# Patient Record
Sex: Male | Born: 1945 | Race: White | Hispanic: No | Marital: Married | Smoking: Former smoker
Health system: Southern US, Community
[De-identification: ages and names within clinical notes are randomized; demographics above are authoritative.]

## PROBLEM LIST (undated history)

## (undated) DIAGNOSIS — Z8481 Family history of carrier of genetic disease: Secondary | ICD-10-CM

## (undated) DIAGNOSIS — Z803 Family history of malignant neoplasm of breast: Secondary | ICD-10-CM

## (undated) HISTORY — PX: NECK SURGERY: SHX720

## (undated) HISTORY — DX: Family history of carrier of genetic disease: Z84.81

## (undated) HISTORY — DX: Family history of malignant neoplasm of breast: Z80.3

## (undated) HISTORY — PX: WISDOM TOOTH EXTRACTION: SHX21

## (undated) HISTORY — PX: TONSILLECTOMY: SUR1361

## (undated) HISTORY — PX: BACK SURGERY: SHX140

## (undated) HISTORY — PX: MITRAL VALVE REPLACEMENT: SHX147

## (undated) HISTORY — PX: ROTATOR CUFF REPAIR: SHX139

## (undated) HISTORY — PX: EYE SURGERY: SHX253

---

## 1997-10-17 ENCOUNTER — Ambulatory Visit (HOSPITAL_COMMUNITY): Admission: RE | Admit: 1997-10-17 | Discharge: 1997-10-17 | Payer: Self-pay | Admitting: *Deleted

## 1998-05-30 ENCOUNTER — Ambulatory Visit (HOSPITAL_COMMUNITY): Admission: RE | Admit: 1998-05-30 | Discharge: 1998-05-30 | Payer: Self-pay | Admitting: Family Medicine

## 1998-05-30 ENCOUNTER — Encounter: Payer: Self-pay | Admitting: Family Medicine

## 2002-06-05 ENCOUNTER — Ambulatory Visit (HOSPITAL_COMMUNITY): Admission: RE | Admit: 2002-06-05 | Discharge: 2002-06-05 | Payer: Self-pay | Admitting: Internal Medicine

## 2004-08-07 ENCOUNTER — Ambulatory Visit: Payer: Self-pay

## 2005-05-19 ENCOUNTER — Encounter (INDEPENDENT_AMBULATORY_CARE_PROVIDER_SITE_OTHER): Payer: Self-pay | Admitting: Specialist

## 2005-05-20 ENCOUNTER — Inpatient Hospital Stay (HOSPITAL_COMMUNITY): Admission: RE | Admit: 2005-05-20 | Discharge: 2005-05-21 | Payer: Self-pay | Admitting: Specialist

## 2005-08-27 ENCOUNTER — Ambulatory Visit: Payer: Self-pay

## 2005-08-27 ENCOUNTER — Encounter: Payer: Self-pay | Admitting: Cardiology

## 2007-02-25 IMAGING — CR DG LUMBAR SPINE 2-3V
3 series · 3 of 3 positions shown · non-contrast
Comparison: none

CLINICAL DATA: Preoperative exam for back surgery in five days.  Left lower pain radiating to the buttock.
 LUMBAR SPINE ? 2 VIEW:

[t l-spine a.p.]
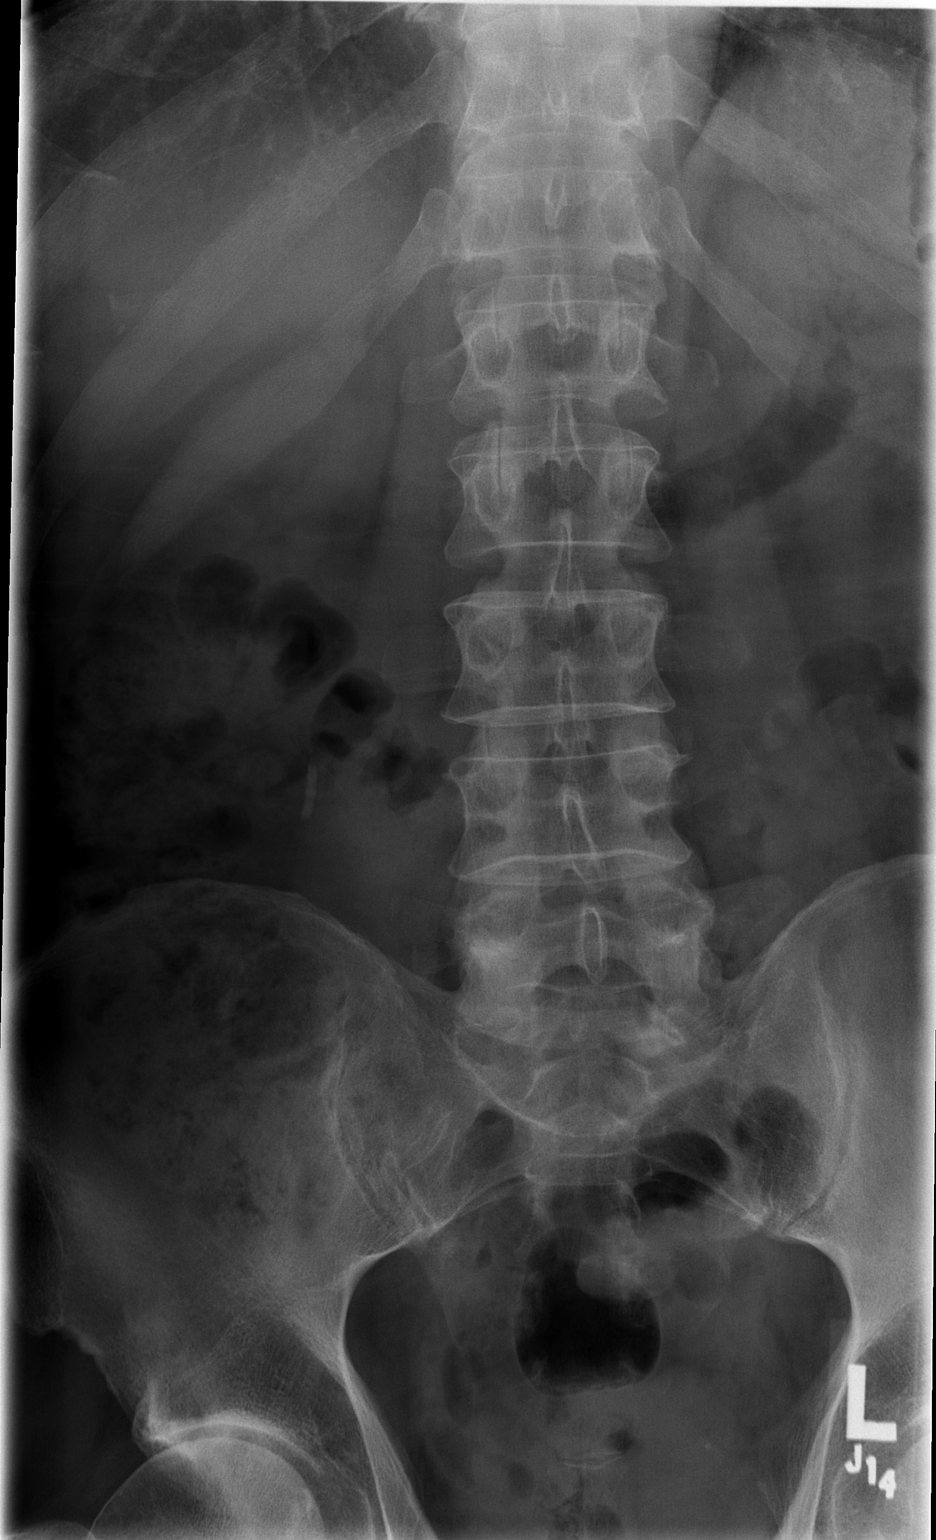

[t l-spine lat]
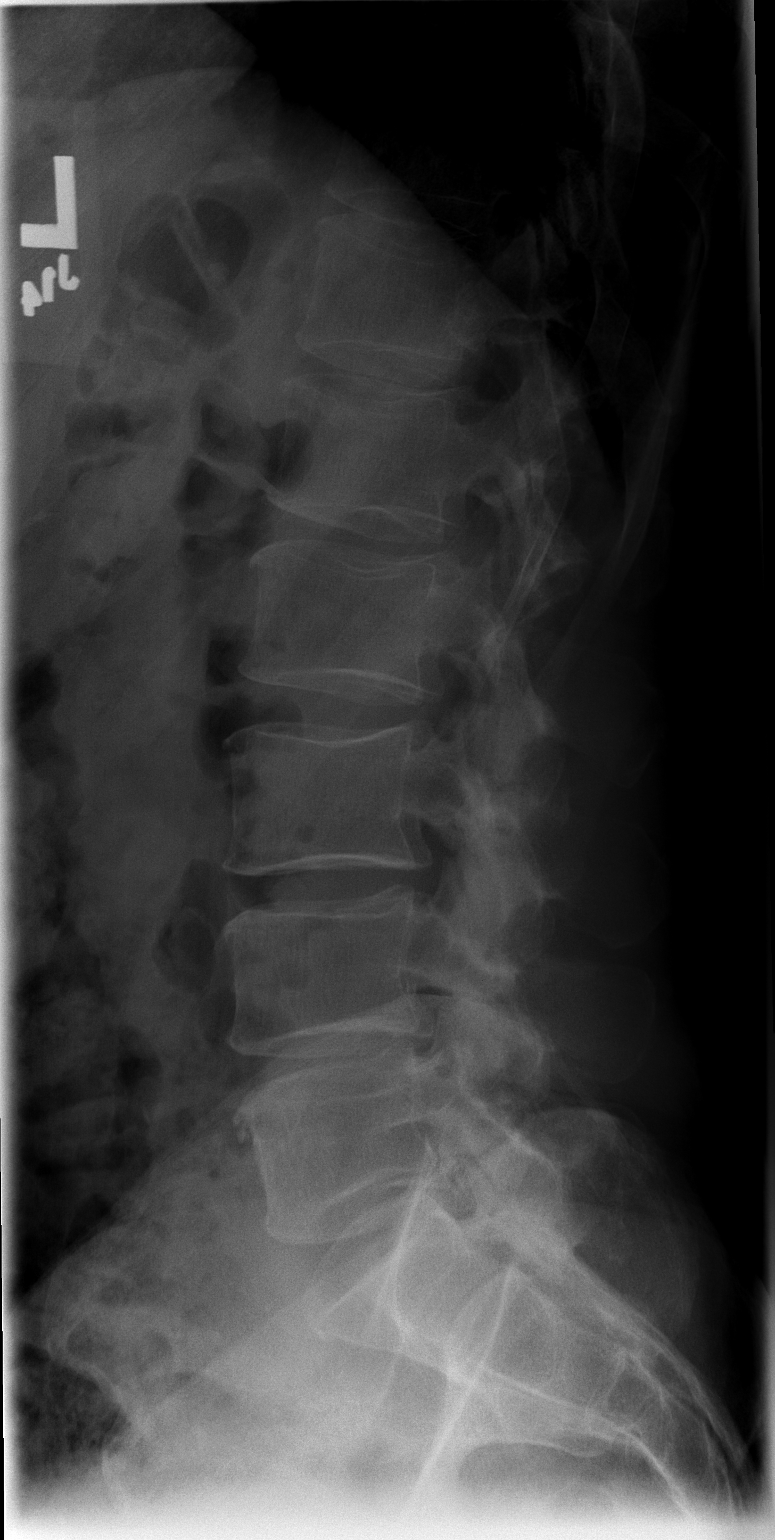

[t l-spine l5-s1 spot]
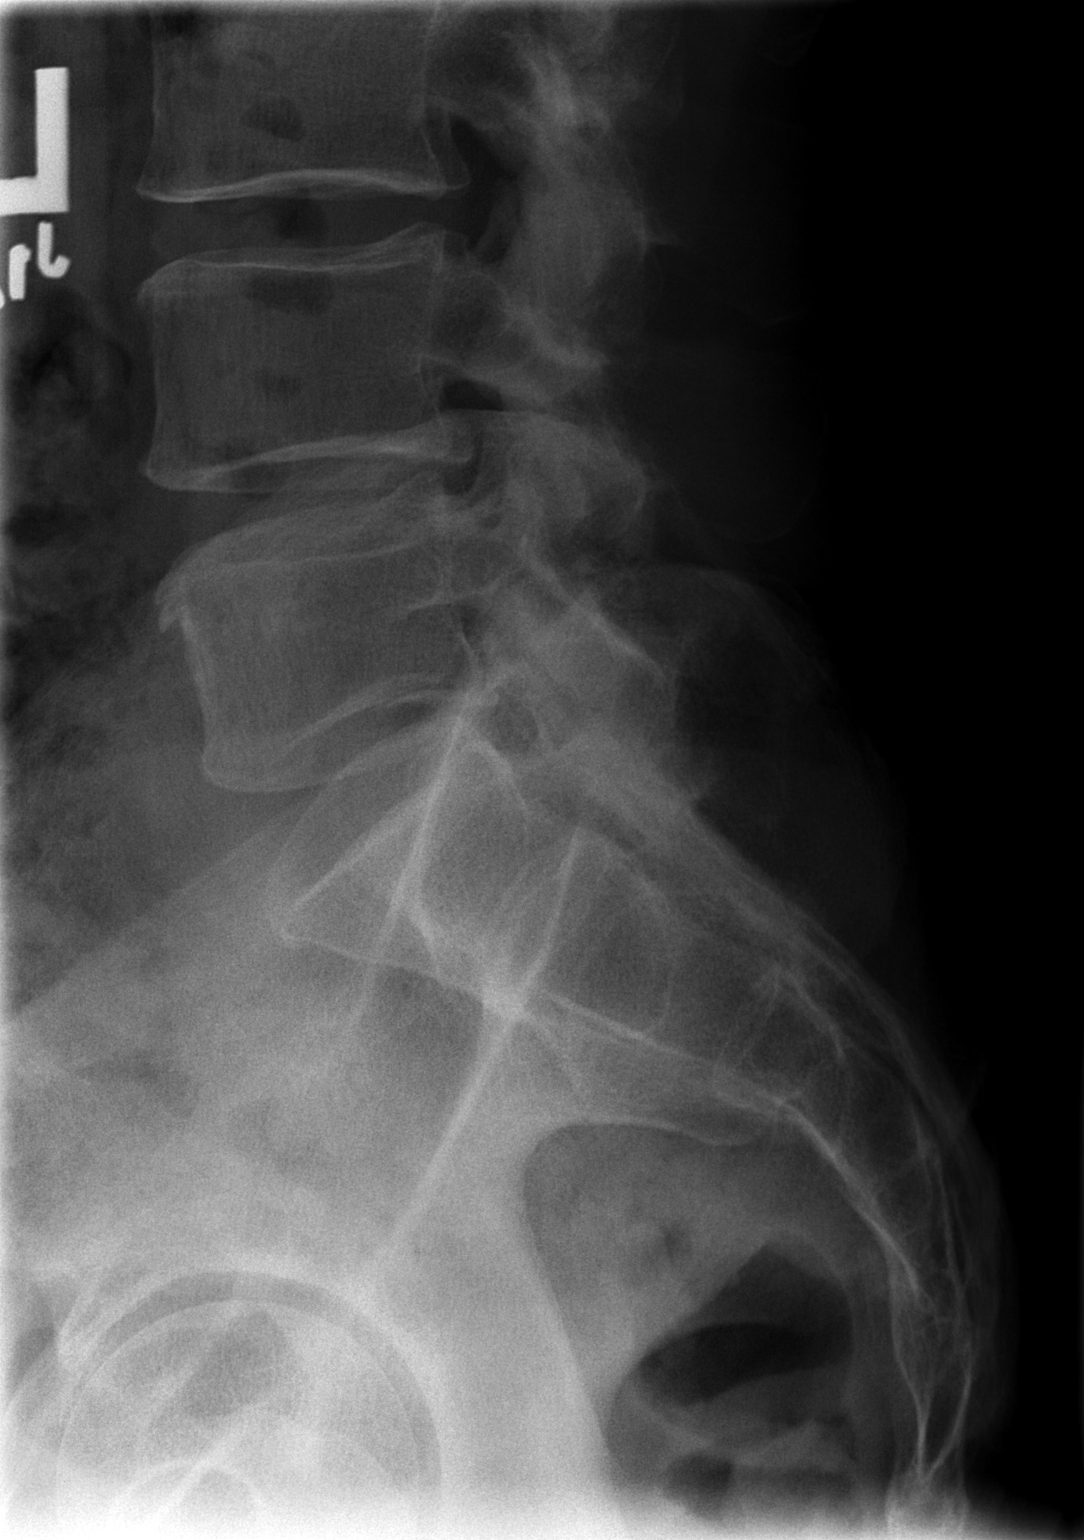

[3 of 3 positions shown; findings below may reference images not displayed]

FINDINGS: There are five lumbar type vertebral bodies in normal alignment.  There is mild disk space narrowing at L3-4 and L4-5.  There are mild facet degenerative changes at L3-4, L4-5, and L5-S1.  No pars defects or slippage.  The SI joints appear unremarkable.
IMPRESSION: Degenerative disk disease and degenerative facet disease in the lower lumbar spine.

## 2007-05-15 ENCOUNTER — Ambulatory Visit (HOSPITAL_COMMUNITY): Admission: RE | Admit: 2007-05-15 | Discharge: 2007-05-15 | Payer: Self-pay | Admitting: Neurosurgery

## 2007-07-04 ENCOUNTER — Inpatient Hospital Stay (HOSPITAL_COMMUNITY): Admission: RE | Admit: 2007-07-04 | Discharge: 2007-07-06 | Payer: Self-pay | Admitting: Neurosurgery

## 2007-07-08 ENCOUNTER — Inpatient Hospital Stay (HOSPITAL_COMMUNITY): Admission: EM | Admit: 2007-07-08 | Discharge: 2007-07-09 | Payer: Self-pay | Admitting: Emergency Medicine

## 2007-07-28 ENCOUNTER — Encounter (INDEPENDENT_AMBULATORY_CARE_PROVIDER_SITE_OTHER): Payer: Self-pay | Admitting: Internal Medicine

## 2007-07-28 ENCOUNTER — Ambulatory Visit: Payer: Self-pay

## 2007-08-18 ENCOUNTER — Ambulatory Visit: Payer: Self-pay | Admitting: Cardiovascular Disease

## 2007-08-25 ENCOUNTER — Ambulatory Visit: Payer: Self-pay | Admitting: Cardiovascular Disease

## 2007-08-25 ENCOUNTER — Ambulatory Visit (HOSPITAL_COMMUNITY): Admission: RE | Admit: 2007-08-25 | Discharge: 2007-08-25 | Payer: Self-pay | Admitting: Cardiovascular Disease

## 2007-09-04 ENCOUNTER — Ambulatory Visit: Payer: Self-pay | Admitting: Cardiovascular Disease

## 2007-09-18 ENCOUNTER — Ambulatory Visit: Payer: Self-pay | Admitting: Thoracic Surgery (Cardiothoracic Vascular Surgery)

## 2007-09-27 ENCOUNTER — Encounter
Admission: RE | Admit: 2007-09-27 | Discharge: 2007-09-27 | Payer: Self-pay | Admitting: Thoracic Surgery (Cardiothoracic Vascular Surgery)

## 2007-10-03 ENCOUNTER — Ambulatory Visit: Payer: Self-pay | Admitting: Cardiovascular Disease

## 2007-10-03 LAB — CONVERTED CEMR LAB
BUN: 15 mg/dL (ref 6–23)
Basophils Absolute: 0 10*3/uL (ref 0.0–0.1)
Calcium: 9.1 mg/dL (ref 8.4–10.5)
Creatinine, Ser: 0.8 mg/dL (ref 0.4–1.5)
Eosinophils Relative: 1.1 % (ref 0.0–5.0)
GFR calc Af Amer: 126 mL/min
GFR calc non Af Amer: 104 mL/min
Glucose, Bld: 103 mg/dL — ABNORMAL HIGH (ref 70–99)
HCT: 40.7 % (ref 39.0–52.0)
INR: 1 (ref 0.8–1.0)
Monocytes Absolute: 0.5 10*3/uL (ref 0.1–1.0)
Monocytes Relative: 9.2 % (ref 3.0–12.0)
Neutro Abs: 4 10*3/uL (ref 1.4–7.7)
Neutrophils Relative %: 68.4 % (ref 43.0–77.0)
Platelets: 239 10*3/uL (ref 150–400)
Prothrombin Time: 12.4 s (ref 10.9–13.3)
WBC: 5.8 10*3/uL (ref 4.5–10.5)
aPTT: 30.8 s — ABNORMAL HIGH (ref 21.7–29.8)

## 2007-10-05 ENCOUNTER — Inpatient Hospital Stay (HOSPITAL_BASED_OUTPATIENT_CLINIC_OR_DEPARTMENT_OTHER): Admission: RE | Admit: 2007-10-05 | Discharge: 2007-10-05 | Payer: Self-pay | Admitting: Cardiovascular Disease

## 2007-10-05 ENCOUNTER — Ambulatory Visit: Payer: Self-pay | Admitting: Cardiovascular Disease

## 2007-11-03 ENCOUNTER — Ambulatory Visit: Payer: Self-pay | Admitting: Surgery

## 2007-11-03 ENCOUNTER — Encounter: Payer: Self-pay | Admitting: Thoracic Surgery (Cardiothoracic Vascular Surgery)

## 2007-11-06 ENCOUNTER — Ambulatory Visit: Payer: Self-pay | Admitting: Thoracic Surgery (Cardiothoracic Vascular Surgery)

## 2007-11-07 ENCOUNTER — Ambulatory Visit: Payer: Self-pay | Admitting: Thoracic Surgery (Cardiothoracic Vascular Surgery)

## 2007-11-07 ENCOUNTER — Inpatient Hospital Stay (HOSPITAL_COMMUNITY)
Admission: RE | Admit: 2007-11-07 | Discharge: 2007-11-11 | Payer: Self-pay | Admitting: Thoracic Surgery (Cardiothoracic Vascular Surgery)

## 2007-11-07 ENCOUNTER — Encounter: Payer: Self-pay | Admitting: Thoracic Surgery (Cardiothoracic Vascular Surgery)

## 2007-11-13 ENCOUNTER — Ambulatory Visit: Payer: Self-pay | Admitting: Cardiology

## 2007-11-13 LAB — CONVERTED CEMR LAB: Prothrombin Time: 16.3 s — ABNORMAL HIGH (ref 10.9–13.3)

## 2007-11-16 ENCOUNTER — Ambulatory Visit: Payer: Self-pay | Admitting: Internal Medicine

## 2007-11-20 ENCOUNTER — Encounter
Admission: RE | Admit: 2007-11-20 | Discharge: 2007-11-20 | Payer: Self-pay | Admitting: Thoracic Surgery (Cardiothoracic Vascular Surgery)

## 2007-11-20 ENCOUNTER — Ambulatory Visit: Payer: Self-pay | Admitting: Thoracic Surgery (Cardiothoracic Vascular Surgery)

## 2007-11-22 ENCOUNTER — Ambulatory Visit: Payer: Self-pay | Admitting: Cardiovascular Disease

## 2007-11-29 ENCOUNTER — Ambulatory Visit: Payer: Self-pay | Admitting: Internal Medicine

## 2007-12-06 ENCOUNTER — Ambulatory Visit: Payer: Self-pay | Admitting: Internal Medicine

## 2007-12-07 ENCOUNTER — Encounter (HOSPITAL_COMMUNITY): Admission: RE | Admit: 2007-12-07 | Discharge: 2008-03-06 | Payer: Self-pay | Admitting: Cardiovascular Disease

## 2007-12-08 ENCOUNTER — Encounter
Admission: RE | Admit: 2007-12-08 | Discharge: 2007-12-08 | Payer: Self-pay | Admitting: Thoracic Surgery (Cardiothoracic Vascular Surgery)

## 2007-12-08 ENCOUNTER — Ambulatory Visit: Payer: Self-pay | Admitting: Thoracic Surgery (Cardiothoracic Vascular Surgery)

## 2007-12-11 ENCOUNTER — Ambulatory Visit: Payer: Self-pay | Admitting: Cardiovascular Disease

## 2007-12-20 ENCOUNTER — Ambulatory Visit: Payer: Self-pay | Admitting: Cardiology

## 2008-01-12 ENCOUNTER — Ambulatory Visit: Payer: Self-pay | Admitting: Cardiovascular Disease

## 2008-04-01 ENCOUNTER — Ambulatory Visit: Payer: Self-pay | Admitting: Thoracic Surgery (Cardiothoracic Vascular Surgery)

## 2008-05-24 ENCOUNTER — Encounter: Payer: Self-pay | Admitting: Thoracic Surgery (Cardiothoracic Vascular Surgery)

## 2008-05-24 ENCOUNTER — Ambulatory Visit: Payer: Self-pay

## 2008-07-08 DIAGNOSIS — I959 Hypotension, unspecified: Secondary | ICD-10-CM

## 2008-07-08 DIAGNOSIS — I4891 Unspecified atrial fibrillation: Secondary | ICD-10-CM

## 2008-07-08 DIAGNOSIS — M4800 Spinal stenosis, site unspecified: Secondary | ICD-10-CM

## 2008-07-08 DIAGNOSIS — R42 Dizziness and giddiness: Secondary | ICD-10-CM

## 2008-07-08 DIAGNOSIS — Q078 Other specified congenital malformations of nervous system: Secondary | ICD-10-CM | POA: Insufficient documentation

## 2008-07-08 DIAGNOSIS — I059 Rheumatic mitral valve disease, unspecified: Secondary | ICD-10-CM | POA: Insufficient documentation

## 2008-07-08 DIAGNOSIS — R002 Palpitations: Secondary | ICD-10-CM | POA: Insufficient documentation

## 2008-07-15 ENCOUNTER — Encounter: Admission: RE | Admit: 2008-07-15 | Discharge: 2008-07-15 | Payer: Self-pay | Admitting: Neurosurgery

## 2008-07-22 ENCOUNTER — Encounter: Admission: RE | Admit: 2008-07-22 | Discharge: 2008-07-22 | Payer: Self-pay | Admitting: Neurosurgery

## 2008-07-25 ENCOUNTER — Telehealth: Payer: Self-pay | Admitting: Cardiovascular Disease

## 2009-08-16 IMAGING — CR DG CHEST 2V
2 series · 2 of 2 positions shown · non-contrast
Comparison: 07/03/2007

CLINICAL DATA: Mitral valve prolapse.  Preoperative  exam.

CHEST - 2 VIEW

[view not recorded (1 of 2)]
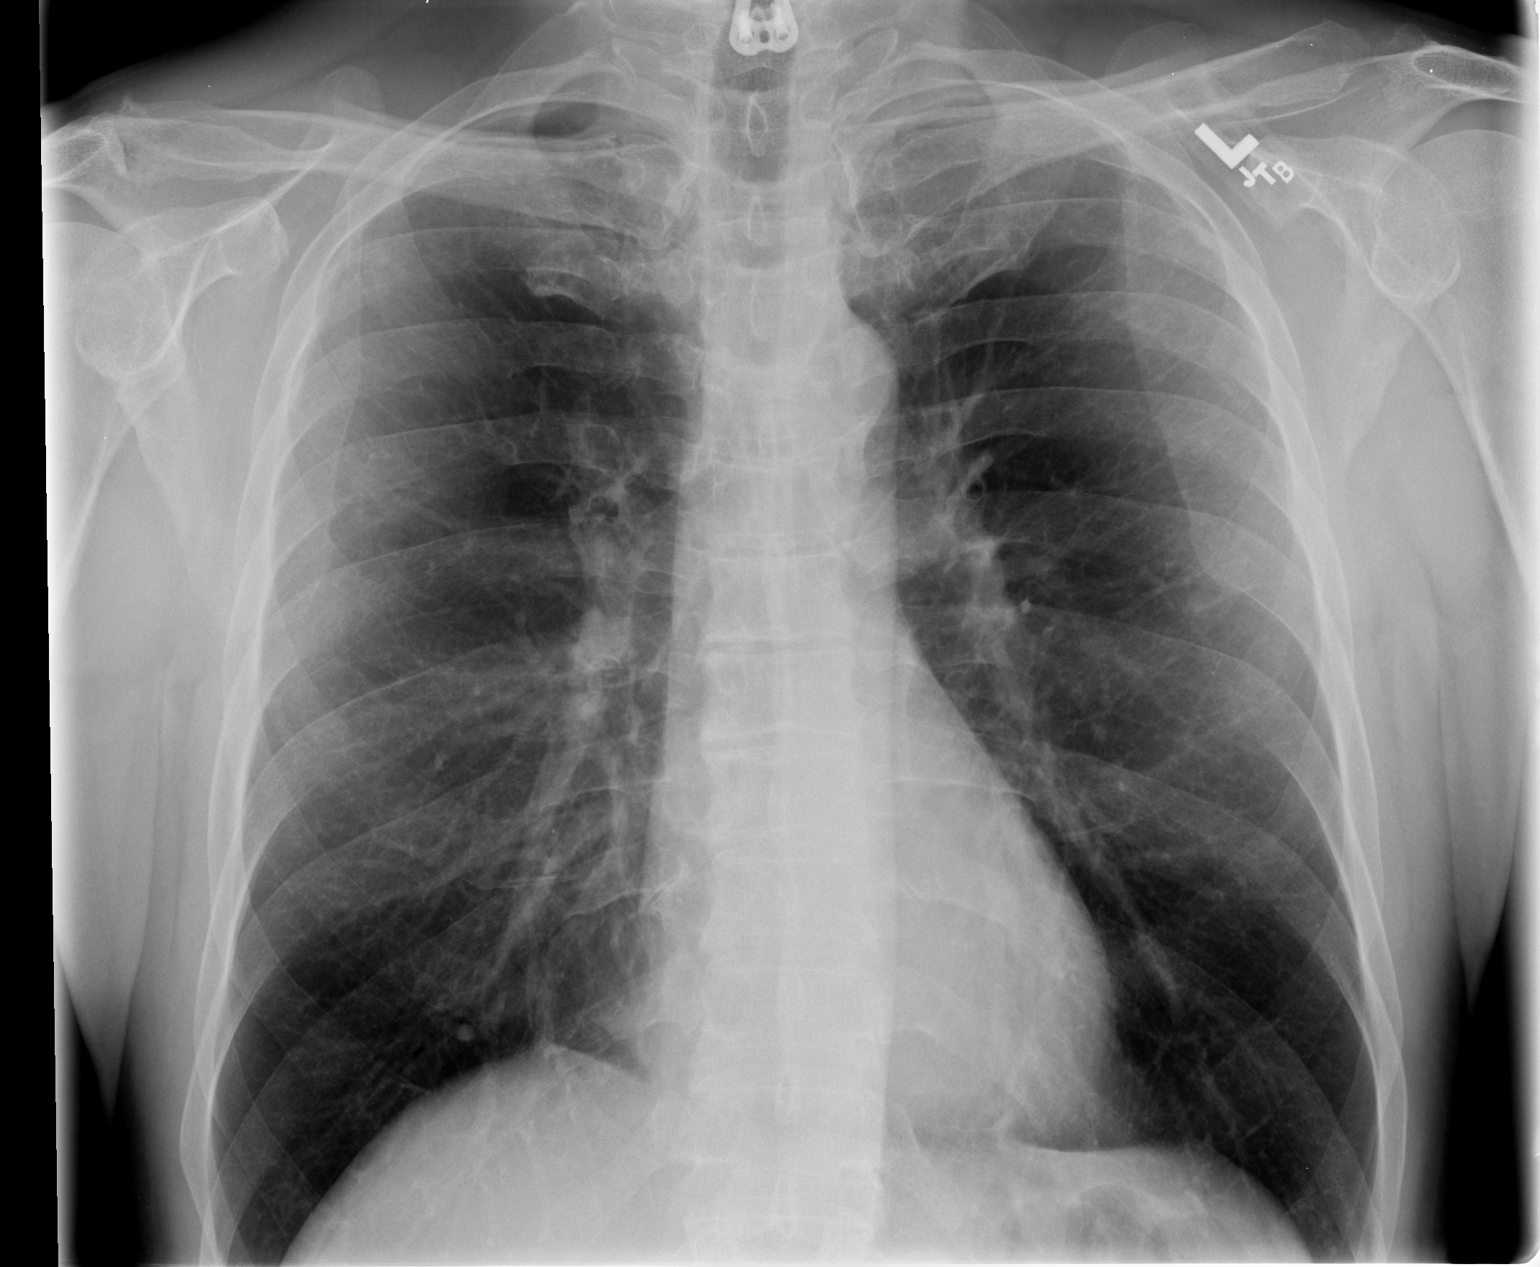

[view not recorded (2 of 2)]
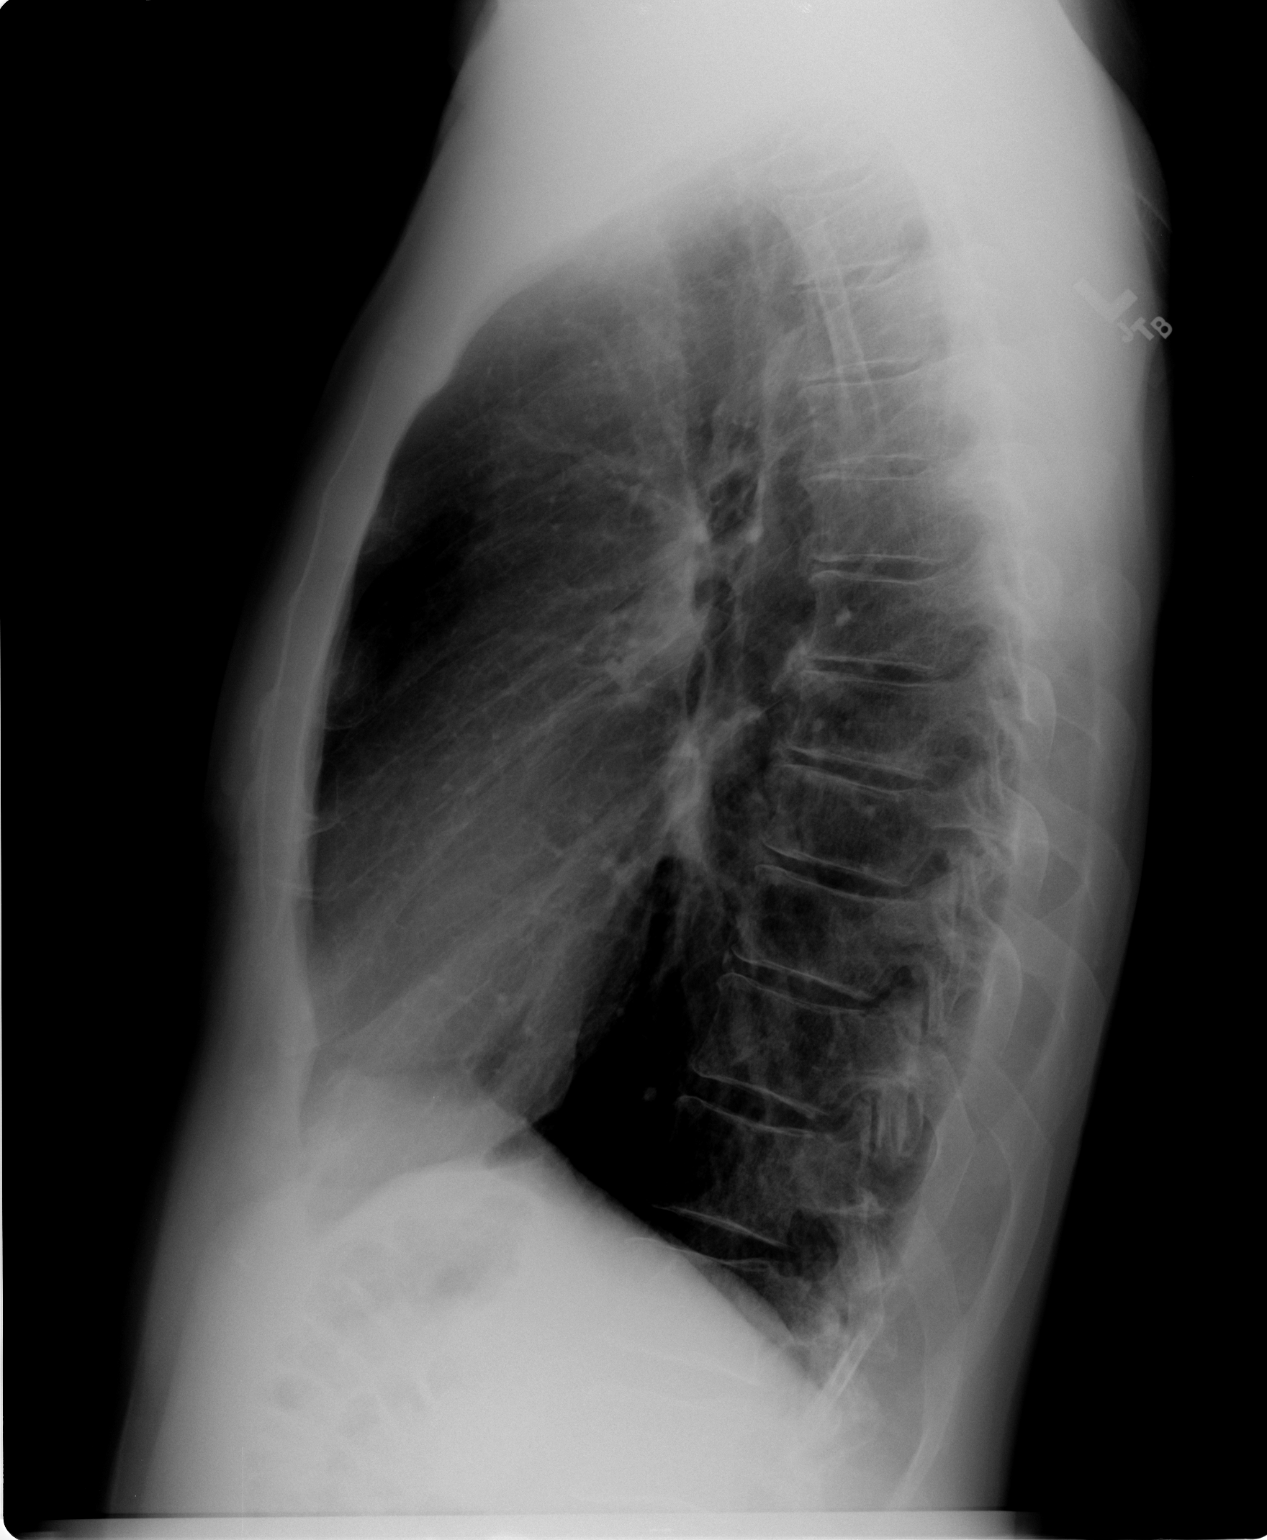

[2 of 2 positions shown; findings below may reference images not displayed]

FINDINGS: The cardiac silhouette, mediastinal and hilar contours
are within normal limits and stable.  There are mild stable changes
of COPD.  No acute pulmonary findings.  The bony thorax is intact.
Fusion hardware is noted in the lower cervical spine.
IMPRESSION: 1.  No acute cardiopulmonary findings.  Stable changes of COPD.

## 2009-12-30 ENCOUNTER — Encounter: Admission: RE | Admit: 2009-12-30 | Discharge: 2009-12-30 | Payer: Self-pay | Admitting: Neurosurgery

## 2010-06-16 ENCOUNTER — Other Ambulatory Visit: Payer: Self-pay | Admitting: Neurosurgery

## 2010-06-16 DIAGNOSIS — M549 Dorsalgia, unspecified: Secondary | ICD-10-CM

## 2010-06-17 ENCOUNTER — Ambulatory Visit
Admission: RE | Admit: 2010-06-17 | Discharge: 2010-06-17 | Disposition: A | Discharge status: Home / Self Care | Payer: Medicare Other | Source: Ambulatory Visit | Attending: Neurosurgery | Admitting: Neurosurgery

## 2010-06-17 ENCOUNTER — Other Ambulatory Visit: Payer: Self-pay | Admitting: Neurosurgery

## 2010-06-17 DIAGNOSIS — M549 Dorsalgia, unspecified: Secondary | ICD-10-CM

## 2010-07-07 ENCOUNTER — Other Ambulatory Visit: Payer: Self-pay | Admitting: Neurosurgery

## 2010-07-07 DIAGNOSIS — M549 Dorsalgia, unspecified: Secondary | ICD-10-CM

## 2010-07-07 DIAGNOSIS — M5410 Radiculopathy, site unspecified: Secondary | ICD-10-CM

## 2010-07-08 ENCOUNTER — Ambulatory Visit
Admission: RE | Admit: 2010-07-08 | Discharge: 2010-07-08 | Disposition: A | Discharge status: Home / Self Care | Payer: Medicare Other | Source: Ambulatory Visit | Attending: Neurosurgery | Admitting: Neurosurgery

## 2010-07-08 DIAGNOSIS — M5410 Radiculopathy, site unspecified: Secondary | ICD-10-CM

## 2010-07-08 DIAGNOSIS — M549 Dorsalgia, unspecified: Secondary | ICD-10-CM

## 2010-07-09 ENCOUNTER — Other Ambulatory Visit (HOSPITAL_COMMUNITY): Payer: Self-pay | Admitting: Neurosurgery

## 2010-07-09 ENCOUNTER — Encounter (HOSPITAL_COMMUNITY)
Admission: RE | Admit: 2010-07-09 | Discharge: 2010-07-09 | Disposition: A | Discharge status: Home / Self Care | Payer: Medicare Other | Source: Ambulatory Visit | Attending: Neurosurgery | Admitting: Neurosurgery

## 2010-07-09 ENCOUNTER — Telehealth: Payer: Self-pay | Admitting: Cardiovascular Disease

## 2010-07-09 ENCOUNTER — Ambulatory Visit (HOSPITAL_COMMUNITY)
Admission: RE | Admit: 2010-07-09 | Discharge: 2010-07-09 | Disposition: A | Discharge status: Home / Self Care | Payer: Medicare Other | Source: Ambulatory Visit | Attending: Neurosurgery | Admitting: Neurosurgery

## 2010-07-09 DIAGNOSIS — Z01818 Encounter for other preprocedural examination: Secondary | ICD-10-CM | POA: Insufficient documentation

## 2010-07-09 DIAGNOSIS — Z01812 Encounter for preprocedural laboratory examination: Secondary | ICD-10-CM | POA: Insufficient documentation

## 2010-07-09 DIAGNOSIS — Z954 Presence of other heart-valve replacement: Secondary | ICD-10-CM | POA: Insufficient documentation

## 2010-07-09 DIAGNOSIS — M48061 Spinal stenosis, lumbar region without neurogenic claudication: Secondary | ICD-10-CM | POA: Insufficient documentation

## 2010-07-09 DIAGNOSIS — Z01810 Encounter for preprocedural cardiovascular examination: Secondary | ICD-10-CM | POA: Insufficient documentation

## 2010-07-09 LAB — PROTIME-INR: Prothrombin Time: 13.1 seconds (ref 11.6–15.2)

## 2010-07-09 LAB — BASIC METABOLIC PANEL
CO2: 29 mEq/L (ref 19–32)
Glucose, Bld: 89 mg/dL (ref 70–99)
Potassium: 4.8 mEq/L (ref 3.5–5.1)
Sodium: 143 mEq/L (ref 135–145)

## 2010-07-09 LAB — CBC
HCT: 44.6 % (ref 39.0–52.0)
Hemoglobin: 15.2 g/dL (ref 13.0–17.0)
MCH: 31.4 pg (ref 26.0–34.0)
MCHC: 34.1 g/dL (ref 30.0–36.0)
MCV: 92.1 fL (ref 78.0–100.0)
RBC: 4.84 MIL/uL (ref 4.22–5.81)

## 2010-07-09 NOTE — Telephone Encounter (Signed)
Faxed LOV & Echo to [facility] (per Delice Bison) at [facility] ([phone]).

## 2010-07-15 ENCOUNTER — Inpatient Hospital Stay (HOSPITAL_COMMUNITY): Payer: Medicare Other

## 2010-07-15 ENCOUNTER — Inpatient Hospital Stay (HOSPITAL_COMMUNITY)
Admission: RE | Admit: 2010-07-15 | Discharge: 2010-07-17 | DRG: 491 | Disposition: A | Discharge status: Home / Self Care | Payer: Medicare Other | Source: Ambulatory Visit | Attending: Neurosurgery | Admitting: Neurosurgery

## 2010-07-15 DIAGNOSIS — M51379 Other intervertebral disc degeneration, lumbosacral region without mention of lumbar back pain or lower extremity pain: Principal | ICD-10-CM | POA: Diagnosis present

## 2010-07-15 DIAGNOSIS — M5137 Other intervertebral disc degeneration, lumbosacral region: Principal | ICD-10-CM | POA: Diagnosis present

## 2010-07-17 NOTE — Op Note (Signed)
  [name], [name] NO.:  [id number]  MEDICAL RECORD NO.:  [id number]           PATIENT TYPE:  I  LOCATION:  3524                         FACILITY:  [facility]  PHYSICIAN:  Hilda Lias, M.D.   DATE OF BIRTH:  Oct 16, 1945  DATE OF PROCEDURE:  07/15/2010 DATE OF DISCHARGE:                              OPERATIVE REPORT   PREOPERATIVE DIAGNOSES:  Degenerative disk disease L4-5, L5-S1 with chronic right radiculopathy status post decompression surgery in the lumbar area.  POSTOPERATIVE DIAGNOSES:  Degenerative disk disease L4-5, L5-S1 with chronic right radiculopathy status post decompression surgery in the lumbar area.  PROCEDURE:  Right L5 hemilaminectomy, lysis of adhesions, foraminotomy to decompress the L4, L5, and S1 nerve root.  Microscope.  SURGEON:  Hilda Lias, M.D.  ASSISTANT:  Danae Orleans. Venetia Maxon, M.D.  CLINICAL HISTORY:  The patient is being admitted because of back pain worsened to the right leg.  The patient prefers to have lumbar decompression.  He is getting worse.  Surgery was advised.  PROCEDURE:  The patient was taken to the OR and after intubation, he was positioned in prone manner.  The back was cleaned with DuraPrep.  Drapes were applied.  Midline incision following the previous one was made and muscle were retracted all the way laterally.  X-rays showed that indeed we were at level L4-5, L5-S1.  We brought immediately microscope into the area.  With the Leksell, we started removing the fibrotic tissue until we were able to visualize the L4 and part of the L5 nerve root. Then with the drill, we started drilling the lower lamina of L4 and we proceeded with removal of lamina of L5.  We started working at the level of L4 and after we removed part of the yellow ligament, we identified normal dura mater.  We were able to decompress the L4 nerve root without any problem.  The main problem was decompression of the L5 nerve root. The patient had  quite a bit of adhesion and we had to remove one-third of the medial facet.  We accomplished lysis of adhesions and at the end using the drill as well as the 1, 2, and 3 mm Kerrison punch, we did a foraminotomy to decompress the L5 nerve root.  At the level of L5-S1, decompression of the S1 was achieved.  We repeated the x-ray which showed that indeed we had plenty of space for all __________.  There was no problem to remove the eschar tissue, which was more dorsal and away from the nerve root.  Valsalva maneuver was negative.  Then Depo-Medrol and fentanyl were left in the epidural space and the wound was closed with Vicryl and Steri-Strips.          ______________________________ Hilda Lias, M.D.     EB/MEDQ  D:  07/15/2010  T:  07/16/2010  Job:  [id number]  Electronically Signed by Hilda Lias M.D. on 07/17/2010 06:59:43 PM

## 2010-07-17 NOTE — H&P (Signed)
  NAME:  [name], [name] NO.:  [id number]  MEDICAL RECORD NO.:  [id number]           PATIENT TYPE:  I  LOCATION:  3524                         FACILITY:  [facility]  PHYSICIAN:  Hilda Lias, M.D.   DATE OF BIRTH:  [birth year]  DATE OF ADMISSION:  07/15/2010 DATE OF DISCHARGE:                             HISTORY & PHYSICAL   [name] is a 65 year old gentleman who is being admitted because of back pain with radiation  to the right leg.  This gentleman about 4-5 years ago underwent surgery by orthopedic surgeon.  Since then, off and on, he had been complaining of mostly pain down to the right leg.  The patient had conservative treatment including epidural injection.  X-rays showed that he has severe case of degenerative disk disease at multiple levels, being worse at the level of 4-5 and 5-1, right worse than the left one.  Because of no improvement and because he feels that he is getting worse, he wants to proceed with surgery.  PAST MEDICAL HISTORY:  He had anterior cervical fusion by me about 3 years ago, lumbar laminectomy, cataract surgery, mitral valve replacement.  He is not allergic to any medication.  SOCIAL HISTORY:  The patient does not smoke.  He drinks wine.  FAMILY HISTORY:  Unremarkable.  REVIEW OF SYSTEMS:  Back pain, right leg pain.  MEDICATIONS:  He is taking mostly over-the-counter medications such as fish oil, vitamins, and aspirin.  PHYSICAL EXAMINATION:  GENERAL:  The patient came to my office 2 days ago limping from the right leg.  Last week, he had a lumbar myelogram. HEAD, EARS, NOSE, AND THROAT:  Normal. NECK:  Normal.  There is a scar anteriorly. LUNGS:  Clear. CARDIAC:  Heart sounds normal. ABDOMEN:  Normal. EXTREMITIES:  Normal pulses. NEUROLOGIC:  Weakness of dorsiflexion of the right foot, like 3/5. Straight leg raising, SLR, is positive on the right side at about 45 degrees, negative on the left side.  He has some  decreased flexibility of the lumbar spine.  X-rays showed multiple level degenerative disk disease being worse at the level of 4-5, 5-1 on the right side.  CLINICAL IMPRESSION:  Right L4-5, L5-S1 degenerative disk disease with chronic radiculopathy.  RECOMMENDATIONS:  The patient is being admitted for surgery.  The procedure will be right 4-5 and 5-1 foraminotomy and decompression of the nerve roots.  He knows the risks of the surgery including the possibility of no improvement whatsoever, CSF leak, need of further surgery which might require lumbar fusion.          ______________________________ Hilda Lias, M.D.     EB/MEDQ  D:  07/15/2010  T:  07/16/2010  Job:  [id number]  Electronically Signed by Hilda Lias M.D. on 07/17/2010 06:59:41 PM

## 2010-08-11 NOTE — Consult Note (Signed)
NEW PATIENT CONSULTATION   [name], PAUTSCH A  DOB:  07/28/1945                                        September 18, 2007  CHART #:  [id number]   REQUESTING PHYSICIAN:  Dr. Noralyn Pick. Nishan.   REASON FOR CONSULTATION:  Severe mitral regurgitation.   HISTORY OF PRESENT ILLNESS:  Mr. Fofana is a 65 year old gentleman from  [place], who was first discovered to have a heart murmur on physical  exam approximately 5 or 6 years ago.  Echocardiogram revealed mitral  valve prolapse with mild mitral regurgitation.  He has been followed for  several years by Dr. Eden Emms.  Serial echocardiograms have been notable  for the presence of progression of mitral regurgitation.  In particular,  a 2-D echocardiogram performed in early May revealed severe mitral  regurgitation with what appears to be a flail segment of the posterior  mitral leaflet that was new in comparison with previous echocardiograms.  Left ventricular systolic function and size remain normal.  No other  abnormalities are noted.  Mr. Moquin subsequently was seen in followup  by Dr. Eden Emms, and a transesophageal echocardiogram was performed.  This  confirmed the presence of probable flail segment of the middle scallop  of the posterior leaflet of the mitral valve with severe (4+) mitral  regurgitation.  Left ventricular function appears normal and there are  no other significant abnormalities.  Mr. Magowan has now been referred  for possible elective mitral valve repair.   REVIEW OF SYSTEMS:  GENERAL:  The patient underwent surgery in April for  anterior exposure to multilevel cervical fixation due to severe  degenerative disk disease of the cervical spine.  Subsequent to this, he  developed shingles with a classical herpetic rash in the right lateral  trunk.  This has taken some time to gradually resolve.  His appetite is  stable and he has not been gaining or losing weight.  He is 6 feet tall  and weighs  approximately 160 pounds.  CARDIAC:  The patient does admit to progressive exertional fatigue with  perhaps very mild exertional shortness of breath.  He states that he  does not really get short of breath much, but he does seem to get tired  and fait more when he exerts himself physically.  He denies any history  of any chest pain, chest tightness, or chest pressure either with  activity or at rest.  He has not had any palpitations.  He has not had  lower extremity edema.  RESPIRATORY:  Negative.  The patient denies productive cough,  hemoptysis, or wheezing.  GASTROINTESTINAL:  Negative.  The patient has no difficulty swallowing.  He denies hematochezia, hematemesis, or melena.  GENITOURINARY:  Negative.  PERIPHERAL VASCULAR:  Negative.  NEUROLOGIC:  Notable for some residual weakness in both lower legs.  The  patient states dates back to his cervical spine surgery in April.  All  of these symptoms have been improving ever since the surgery.  Previous  problems with numbness in his hands have almost completely resolved.  PSYCHIATRIC:  Negative.  HEENT:  Negative.  The patient sees his dentist regularly and has no  ongoing dental issues.  HEMATOLOGIC:  Negative.   PAST MEDICAL HISTORY:  1. Mitral valve prolapse with mitral regurgitation.  2. Degenerative disk disease of the cervical and lumbar spine.  3. Herpes zoster exacerbation beginning in May 2009.   PAST SURGICAL HISTORY:  1. Anterior exposure for four level cervical fusion in April 2009 by      Dr. Jeral Fruit.  2. Lumbar laminectomy and diskectomy in 2006.  3. Multiple surgeries on the left eye for macular degeneration and      cataracts.  4. Left rotator cuff repair.   FAMILY HISTORY:  Noncontributory.   SOCIAL HISTORY:  The patient is married and lives with his wife here in  [place].  He is retired from [employer] and formally from  [employer].  He has a remote history of tobacco use, but quit  smoking in  1972.  He denies excessive alcohol consumption.   CURRENT MEDICATIONS:  1. Fish oil capsule once daily.  2. Advil as needed for musculoskeletal pain.   DRUG ALLERGIES:  None known.   PHYSICAL EXAMINATION:  The patient is a thin, well-appearing male, who  appears his stated age or perhaps somewhat younger, in no acute  distress.  Blood pressure 128/72, pulse 66 regular, and oxygen  saturation 95% on room air.  HEENT exam is unrevealing.  There is a  surgical scar in the left anterior neck consistent with recent anterior  exposure for cervical fixation.  There are no carotid bruits.  Auscultation of the chest reveals clear breath sounds, which are  symmetrical bilaterally.  No wheezes or rhonchi noted.  Cardiovascular  exam is notable for regular rate and rhythm.  There is a prominent grade  3-4/6 holosystolic murmur heard at the apex with radiation across the  precordium to the axilla and back.  No diastolic murmurs are noted.  The  abdomen is soft, nondistended, and nontender.  There are no palpable  masses.  The liver edge is not palpable.  The extremities are warm and  well-perfused.  Femoral pulses are strong and symmetrical.  Lower  extremity pulses are strong and symmetrical in the posterior tibial  position.  There is no lower extremity edema.  There is no sign of  venous insufficiency.  Rectal and GU exams are both deferred.  Neurologic examination is grossly nonfocal.   DIAGNOSTIC TESTS:  Transesophageal echocardiogram performed by Dr.  Eden Emms is reviewed.  This demonstrates what appears to be a flail  segment middle scallop of the posterior leaflet of mitral valve with  severe (4+) mitral regurgitation directed anteriorly around the left  atrium.  There is mild left atrial enlargement.  There is normal left  ventricular size and function.  The aortic valve appears normal.  No  other abnormalities noted.   IMPRESSION:  Mitral valve prolapse with severe mitral  regurgitation,  probable flail segment of the middle scalp of the posterior leaf of the  mitral valve.  Mr. Iseman has mild symptoms of exertional fatigue and  shortness of breath.  He is recently recovering from a bout of herpes  zoster that developed during the early postoperative recovery phase  following his recent anterior cervical fusion.  I believe he would be an  excellent candidate for mitral valve repair.  He has not yet had heart  catheterization performed.  In the absence of significant coronary  artery disease, he would likely be a good candidate for minimally  invasive approach to surgical treatment for mitral regurgitation.   PLAN:  I have discussed options at length with Mr. Sia and his wife  here in the office today.  We have gone through things in detail  including what  I feel would be a high likelihood of successful valve  repair given his TEE findings.  Under the circumstances I think he would  be an excellent candidate for minimally invasive approach in the absence  of significant coronary artery disease.  All the questions have been  addressed.  I do think it would be reasonable to hold off on surgery for  least a few more weeks to allow him to completely recovered from his  recent bout with shingles, as repeat surgery could certainly stir this  up a again.  We will plan a CT angiogram of the thoracic and abdominal  aorta and iliac vessels to rule out significant atherosclerotic burden,  that might change our approach with minimally invasive techniques.  He  will need left and right heart catheterization, and we will ask that his  cath be performed via the left femoral approach, as we will plan to use  the right femoral artery for arterial cannulation at the time of  surgery.  Mr. Pallone has requested that we tentatively schedule surgery  on  Tuesday, November 07, 2007.  He will return to see me in the office on  Monday, November 06, 2007 for final followup, to  review his cath findings,  and make final plans for surgery.   Salvatore Decent. Cornelius Moras, M.D.  Electronically Signed   CHO/MEDQ  D:  09/18/2007  T:  09/19/2007  Job:  [id number]   cc:   Noralyn Pick. Eden Emms, MD, [facility]  Georgianne Fick, M.D.

## 2010-08-11 NOTE — Assessment & Plan Note (Signed)
OFFICE VISIT   [name], [name] A  DOB:  1945/05/23                                        December 08, 2007  CHART #:  [id number]   HISTORY OF PRESENT ILLNESS:  The patient returns for further followup  status post right miniature thoracotomy for mitral valve repair.  He was  last seen here in the office on November 20, 2007.  At that time, he was  noted to be in rapid atrial fibrillation.  He was started on oral  amiodarone and digoxin.  Two days later, he was seen by Dr. Eden Emms and  he had already converted back to normal sinus rhythm.  Since then, the  patient notes that he has not had any further tachy palpitations.  He  has continued to improve overall.  His biggest complaint at this time is  that he has been having trouble sleeping at night.  He has mild residual  soreness in the right chest appropriate for his recent thoracotomy  incision.  He has not had any shortness of breath.  He is eating very  well.  He has had no dizzy spells.  Overall, he feels well and is eager  to start increasing his physical activity.  His Coumadin dosing has been  adjusted to the [facility].  At one point, his INR got up  to 5, but most recently, it is back down to therapeutic range.   CURRENT MEDICATIONS:  Coumadin, amiodarone, digoxin, baby aspirin, and  Ultram occasionally for pain.  He has not required any pain medicine for  quite sometime now.   PHYSICAL EXAMINATION:  GENERAL:  Notable for well-appearing male.  VITAL SIGNS:  Blood pressure 121/76, pulse of 74 and 2-channel telemetry  rhythm strip demonstrates normal sinus rhythm.  Oxygen saturation is 97%  on room air.  CHEST:  His small thoracotomy incision is healing nicely.  Breath sounds  are clear to auscultation and symmetrical bilaterally.  No wheezes or  rhonchi are demonstrated.  CARDIOVASCULAR:  Notable for regular rate and rhythm.  No murmurs, rubs,  or gallops are noted.  ABDOMEN:  Soft  and nontender.  EXTREMITIES:  Warm and well perfused.  There is no lower extremity  edema.  Distal pulses are palpable in the posterior tibial position.  The small right groin incision has healed well.  No other abnormalities  are noted.   DIAGNOSTIC TEST:  Chest x-ray performed today at the [facility] is reviewed.  This reveals clear lung fields bilaterally.  There  are no pleural effusions.  There is no pulmonary edema.  The cardiac  silhouette is normal.   IMPRESSION:  The patient is doing well now approximately 1 month status  post right miniature thoracotomy for mitral valve repair.  It does not  appear that he is having any more problems with postoperative atrial  fibrillation.   PLAN:  I have encouraged the patient to increase his physical activity  as tolerated without any specific limitations at this time.  I think, it  is reasonable for him to resume driving an automobile.  I have  encouraged him to get involve in the cardiac rehabilitation program.  At  some point, I would like to see a followup echocardiogram to be done by  Dr. Eden Emms and colleagues at their discretion.  At some point, it will  also be reasonable to wean off amiodarone and ultimately stop Coumadin,  again, we will leave this to Dr. Fabio Bering discretion.  We will plan to  see the patient back in 3 months' time.  All of his questions have been  addressed.   Salvatore Decent. Cornelius Moras, M.D.  Electronically Signed   CHO/MEDQ  D:  12/08/2007  T:  12/08/2007  Job:  [id number]   cc:   Noralyn Pick. Eden Emms, MD, [facility]  Georgianne Fick, M.D.

## 2010-08-11 NOTE — Assessment & Plan Note (Signed)
[name]                            CARDIOLOGY OFFICE NOTE   [name], [name]                    MRN:          [id number]  DATE:09/04/2007                            DOB:          01-24-1946    [name] is a 65 year old patient with a flail segment to the  posterior leaflet of mitral valve.  His TEE confirmed this with severe  MR.  There may be a cord in the left atrium that can be seen.  We went  over all of his TEE results with he and his wife, they had a myriad of  questions.  Again, they are actively looking into minimally invasive  surgery as well as possible robotic surgery.   I explained to them that Dr. Cornelius Moras has done minimally invasive surgery as  of a few weeks ago here at [facility].  I would like them to visit with him  because I think he would be an excellent surgeon for them.   We discussed in detail the need for heart catheterization to rule out  coronary disease.  This can be done within a week or two of surgery.  The patient is getting over some shingles on the right side of his ribs.  I suspect that these are well healed enough to not be an issue in  regards to possibly doing an operation in the next month or two.  The  patient will be traveling over the next few weeks.  I would like to make  his appointment with Dr. Cornelius Moras sometime after September 13, 2007.  He has had  occasional PACs.  I also explained to him there is an increased risk of  developing AFib the longer he waits.  The literature would suggest that  he will become symptomatic within 2 years with a flail segment.   I encouraged him to have his surgery within the next 6 months.   Otherwise, he is doing well.  He is not having chest pain.  He has  minimal exertional dyspnea.  There has been no palpitations or evidence  of AFib.  His review of systems is remarkable for shingles on the right  rib area, which is healing.  He has been on Zovirax.  He otherwise only  takes  fish oil.   EXAM:  GENERAL:  Remarkable for a thin white male with premature gray  hair.  VITAL SIGNS:  Weight is 169, blood pressure 120/74, pulse 69 with  occasional PACs, and afebrile.  HEENT:  Unremarkable.  Status post anterior cervical neck surgery by Dr.  Jeral Fruit.  This has healed well.  No carotid bruits.  No lymphadenopathy,  JVP elevation, or bruits.  LUNGS:  Clear, good diaphragmatic motion.  No wheezing.  CARDIAC:  There is an S1 and S2 with a loud MR murmur at the apex.  PMI  is normal.  ABDOMEN:  Benign.  Bowel sounds are positive.  No AAA.  No tenderness.  No bruit.  EXTREMITIES:  Distal pulses are intact.  No edema.  SKIN:  Warm and dry outside of the healing, shingles on the right  rib  area.  NEURO:  Nonfocal.  No muscular weakness.   IMPRESSION:  1. Mitral valve prolapse with flail segment, severe mitral      regurgitation.  Appointment made with Dr. Cornelius Moras to discuss surgery,      possibly using the minimally invasive approach.  2. Need for mitral valve repair.  We will schedule right and left      heart catheterization in a week or 2 prior to procedure.  3. Shingles seem to be healing well, should not be an infectious issue      in regards to coronary artery bypass graft along with it if it is      done in a month or so.  4. Anterior cervical neck surgery by Dr. Jeral Fruit, healing well.  No      obvious abnormalities.  Followup with Dr. Jeral Fruit.   Again, a significant amount of time was spent with the patient  discussing surgical options, options in terms of local surgeons as well  as the [facility].  We also went into detail about the findings of  his TEE.  Total time spent with the patient was 55 minutes.      Noralyn Pick. Eden Emms, MD, [facility]  Electronically Signed    PCN/MedQ  DD: 09/04/2007  DT: 09/05/2007  Job #: [id number]   cc:   Salvatore Decent. Cornelius Moras, M.D.

## 2010-08-11 NOTE — Discharge Summary (Signed)
NAMEJAFET, [name] NO.:  [id number]   MEDICAL RECORD NO.:  [id number]          PATIENT TYPE:  INP   LOCATION:  2017                         FACILITY:  [facility]   PHYSICIAN:  Salvatore Decent. Cornelius Moras, M.D. DATE OF BIRTH:  1945-07-28   DATE OF ADMISSION:  11/07/2007  DATE OF DISCHARGE:  11/11/2007                               DISCHARGE SUMMARY   PRIMARY ADMITTING DIAGNOSIS:  Severe mitral regurgitation.   ADDITIONAL/DISCHARGE DIAGNOSES:  1. Severe mitral regurgitation.  2. History of severe degenerative cervical disk disease status post      multilevel cervical fixation.  3. History of recent herpes zoster.  4. Remote history of tobacco abuse.   PROCEDURE PERFORMED:  1. Right minithoracotomy for Mitral valve repair with triangular      resection of posterior leaflet and 28-mm Edwards Physio II Ring      Annuloplasty.   HISTORY:  The patient is a 65 year old male with a known history of  mitral valve prolapse and mitral regurgitation.  He was then followed  for the past 5-6 years by Dr. Eden Emms with serial echocardiograms.  His  most recent 2D echo performed in early May 2009 revealed severe MR with  what appeared to be a flail segment of the posterior mitral leaflet that  was new compared to previous echos.  His left ventricular systolic  function were normal.  There were no other abnormalities.  He  subsequently underwent a TEE and this showed probable flail segment at  the middle scalp of the posterior leaflet with severe 4+ MR.  He was  subsequently referred to Dr. Lemont Fillers outpatient consultation for  consideration of surgical repair.  He did undergo a left and right  catheterization, which showed normal coronary anatomy and no significant  coronary artery disease.  Dr. Cornelius Moras reviewed all the risks, benefits, and  alternatives of the surgery with the patient and he agreed to proceed.  After the course, he was admitted to [facility] on November 07, 2007, and underwent a mitral valve repair via a right minithoracotomy  approach.  He tolerated the procedure well and was transferred to the  SICU in stable condition.  He was able to be extubated shortly after  surgery.  He was hemodynamically stable.  He did require pressors  initially for hypertension, but these were weaned and discontinued over  the course of his first postoperative day.  He was started on Coumadin.  By postoperative day #2, he was ready for transfer to the floor of  cardiac rehab phase I and is making good progress.  His chest tubes has  been removed and a followup chest x-ray is pending.  His INR is trending  upward with present value of 1.4 and a PT of 17.8.  He is being diuresed  and currently is with 1 kg of his preoperative weight.  His incisions  are healing well.  He was having minimal incisional discomfort.  He is  tolerating a regular diet having normal bowel and bladder function.  The  remainder of his labs have been stable with a hemoglobin of 7.1,  hematocrit 29.6, platelets 91,000, BUN 14, and creatinine 0.8.  It is  anticipated that if he remains stable over the next 24 hours, he will  hopefully to be ready for discharge home by November 11, 2007.   DISCHARGE MEDICATIONS:  1. Coumadin home dose to be determined by PT and INR drawn on the date      of discharge.  2. Ultram 50-100 mg q.4-6 h. p.r.n. for pain.  3. Aspirin 81 mg daily.   DISCHARGE INSTRUCTIONS:  He was asked to refrain from driving, heavy  lifting, or strenuous activity.  He may continue ambulating daily and  using his incentive spirometry.  He may shower daily and clean his  incisions with soap and water.  He will continue with same preoperative  diet.   DISCHARGE FOLLOWUP:  He will need to make an appointment to see Dr.  Eden Emms in the office in 2 weeks.  He will also followup in the next week  to 10 days with Dr. Cornelius Moras with a chest x-ray from [facility] imaging.  In  the interim, if he  experiences any problems, he is asked to call our  office.      Coral Ceo, P.A.      Salvatore Decent. Cornelius Moras, M.D.  Electronically Signed    GC/MEDQ  D:  11/10/2007  T:  11/11/2007  Job:  [id number]   cc:   Noralyn Pick. Eden Emms, MD, [facility]  Georgianne Fick, M.D.

## 2010-08-11 NOTE — Cardiovascular Report (Signed)
[name], [name] NO.:  [id number]   MEDICAL RECORD NO.:  [id number]          PATIENT TYPE:  OIB   LOCATION:  1966                         FACILITY:  [facility]   PHYSICIAN:  Peter C. Eden Emms, MD, FACCDATE OF BIRTH:  03/18/1946   DATE OF PROCEDURE:  DATE OF DISCHARGE:  10/05/2007                            CARDIAC CATHETERIZATION   A 61-year patient with severe MR secondary to prolapse.  The patient is  to have minimally invasive mitral valve repair by Dr. Cornelius Moras.  Catheterization was done prior to surgery to assess coronary arteries  and filling pressures.   A cine catheterization was done from the right femoral artery and vein.  Dr. Cornelius Moras wanted to preserve the right femoral artery for cannulation  during bypass surgery.   Left main coronary artery was normal.   Left anterior descending artery was normal.  There was a large  intermediate branch which was normal.  The first diagonal branch was  normal.   Circumflex coronary artery was normal in its proximal, mid and AV circ  portion.  There was one large branching obtuse marginal branch which was  normal.   The right coronary artery was dominant.  There was one large RV branch  in the PDA.  There were normal.   RAO ventriculography.  RAO ventriculography showed normal LV function  with an EF of 60%.  There was angiographic grade 3/4 mitral  insufficiency.   Right heart pressure was done to assess for pulmonary hypertension given  the patient's dyspnea and severe MR.  Mean right atrial pressure was 6,  RV pressure was 26/11, pulmonary capillary wedge pressure was 10 but  there was a significant V-wave, LV pressure was 112/13. aortic pressure  was 114/68.   Cardiac output was 4.8 liters per minute with a cardiac index of 2.5  liters per minute per meter squared.   IMPRESSION:  Severe mitral insufficiency.  The patient is an ideal  candidate for minimally invasive mitral valve repair.  He tolerated the  procedure well.  He will follow up with Dr. Cornelius Moras to set the time for his  surgery.      Noralyn Pick. Eden Emms, MD, [facility]  Electronically Signed     PCN/MEDQ  D:  10/05/2007  T:  10/05/2007  Job:  [id number]   cc:   Salvatore Decent. Cornelius Moras, M.D.

## 2010-08-11 NOTE — Assessment & Plan Note (Signed)
[facility]                            CARDIOLOGY OFFICE NOTE   [name], [name]                    MRN:          [id number]  DATE:11/22/2007                            DOB:          [birth year]    [name] returns today for followup.  He had prolapse of the P2  segment of the posterior leaflet and underwent minimally invasive mitral  repair by Dr. Cornelius [name].  He did extremely well.   Unfortunately, I believe 2 days ago, he had rapid palpitations and was  in atrial fibrillation.  He was seeing Dr. Cornelius [name].  We talked about the  situation over the phone and since he was relatively hypotensive, we  decided to start him on amiodarone 400 b.i.d. and digoxin.  Since that  time, he is appeared to converted back to sinus rhythm.  I talked to  [name] and his wife at length.  I prefer to probably keep him on  Coumadin for 2-3 months and monitor his rhythm.  He will continue his  amiodarone at 400 twice a day for 2 weeks and then we will taper him  down to 200 twice a day.  He will have to have his Coumadin level  checked regularly since it will be variable depending on the dose of  amiodarone.  The patient is fully aware of the long-term side effects of  amiodarone in regards to thyroid, liver, and lung, but I think a short  term use is reasonable.   Otherwise, he is doing well.  He has not had palpitations in the last  day or so.  He has not had a TIA, no bleeding diathesis.  His previous  neck surgery has healed well and his shingles has gone from his right  side.   He did not have any concomitant coronary artery disease.   I will have to review Dr. Barry [name]' note, but I do not think that he did a  maze procedure at the time of the mitral valve repair since the patient  had not had any previous clinical atrial arrhythmias.   PHYSICAL EXAMINATION:  GENERAL:  Remarkable for thin, aesthetic, white  male who is a regular rhythm at rate of 80.  VITAL SIGNS:   His blood pressure is 112/67, weight is 164, respiratory  rate 14, and afebrile.  HEENT:  Unremarkable.  NECK:  Carotids normal without bruit.  No lymphadenopathy, thyromegaly,  or JVP elevation.  He has previous anterior cervical neck scar on the  left side.  LUNGS:  Clear diaphragmatic motion.  No wheezing.  CARDIAC:  There is S1 and S2.  Normal heart sounds.  MR murmur has gone.  Lateral thoracotomy is healing well.  ABDOMEN:  Benign.  Bowel sounds positive.  No AAA.  No tenderness.  No  bruit.  No hepatosplenomegaly.  No hepatojugular reflux.  No tenderness.  EXTREMITIES:  Distal pulses are intact.  No edema.  NEURO:  Nonfocal.  SKIN:  Warm and dry.  No muscular weakness.   EKG documents normal sinus rhythm at rate of 80 with a QT interval of  416.  CURRENT MEDICATIONS:  1. Coumadin as directed.  2. Dig 0.125 a day.  3. Amiodarone 400 b.i.d.   He has no known allergies.   IMPRESSION:  1. Status post mitral valve repair.  Followup echo in a month or so to      assess left ventricular function and document repair status.  2. Atrial arrhythmia, postoperative, not unusual.  Continue amiodarone      taper.  Continue Coumadin for at least another month.  Follow up in      the Coumadin Clinic for his anticoagulation.  3. Anterior cervical spine surgery, it seems to be improving.  Wound      is not well healed.  4. Recent shingles, also resolved with no residual pain.   Overall, [name] is making slow progress.  The rapid atrial fibrillation  was a bit of the setback, since it did cause him some anxiety and he was  relatively hypotensive with it.  However, I think that he should  continue to improve and hopefully we will have him off Coumadin and  amiodarone within the next 8 weeks.   I will see him back in 4 weeks and he will see the Coumadin Clinic  today.     Noralyn Pick. Eden Emms, MD, [facility]  Electronically Signed    PCN/MedQ  DD: 11/22/2007  DT: 11/23/2007  Job #: [id number]

## 2010-08-11 NOTE — Assessment & Plan Note (Signed)
[facility]                            CARDIOLOGY OFFICE NOTE   [name], [name]                    MRN:          [id number]  DATE:12/11/2007                            DOB:          06-Dec-1945    Kinta return turns today for followup.  He is status post minimally  invasive mitral valve repair for prolapsed P2 segment of his posterior  leaflet.   Postop course was complicated by some dysautonomia and postural  hypotension as well as paroxysmal atrial fibrillation.  He seems to be  improving.  He finished his first day of a cardiac rehab at [facility], it  went fairly well.  [name] tends to get dehydrated.  He is somewhat of  anesthetic person with likely associated dysautonomia from his prolapse.  The importance of hydration was discussed with him.  He has been  maintaining sinus rhythm.  His INRs were up a little bit and his dosages  were adjusted.  I told him I thought that he could come off his  Coumadin, digoxin, and amiodarone by the end of months.   For the time being he will stop his digoxin today and decrease  amiodarone to 200 once a day.   REVIEW OF SYSTEMS:  Remarkable for no recurrent palpitations.  No chest  pain.  He is gaining some strength.  He continues to have a bit of  postural dizziness, which he has had even before his surgery.   He has had the right-sided shingles, has healed.  His neck is improving  after recent anterior cervical surgery.   He has no known allergies.  He and his wife will be going to [place]  at end of October.  I told him I would probably see him a week or two  before this.  I did encourage him to take his amiodarone, digoxin, and  Coumadin with him to [facility] in case his AFib were to recur.   He asked to see to go back to driving and I said at this point he could.  He also asked about having red wine with dinner and I said this was fine  as well.   PHYSICAL EXAMINATION:  GENERAL:   Remarkable for thin white male, in no  distress.  VITAL SIGNS:  Weight is 162, blood pressure is 116/71, it is not  postural, pulse 73 and regular and sinus, respiratory rate 14, and  afebrile.  HEENT:  Unremarkable.  NECK:  Carotids normal without bruit.  No lymphadenopathy, thyromegaly,  or JVP elevation.  Status post anterior cervical surgery with a well-  healed scar.  LUNGS:  Clear with good diaphragmatic motion.  No wheezing.  S1 and S2.  No MR murmur.  PMI normal scar is well healed cannulation site for  bypass and right femoral artery well-healed without residual bruit.  EXTREMITIES:  Distal pulses are intact.  No edema.  NEUROLOGIC:  Nonfocal.  SKIN:  Warm and dry.  No muscular weakness.   IMPRESSION:  1. Status post mitral valve repair.  Follow up echo sometime in the      neck  6 months as a baseline.  2. Paroxysmal atrial fibrillation to be off digoxin, amiodarone, and      Coumadin by the end of the month.  No evidence of recurrence.  3. Shingles, recently recovered.  Currently off Zovirax.  4. Prophylactic flu coverage, the patient is to get H1N1 flu shot and      regular flu shot within the next week next.  5. Cardiac rehab to continue 6-week course finished [place]  6. Alcohol intake, I told him it was okay to have a glass red wine      with dinner.  7. Dizziness postural, keep well hydrated.  No evidence of significant      dysautonomia.  I will see the patient back in October prior to his      trip [place], I am pleased with continued progress he      is making after open heart surgery.     [name]. Dustin Emms, MD, [facility]  Electronically Signed    PCN/MedQ  DD: 12/11/2007  DT: 12/12/2007  Job #: [id number]

## 2010-08-11 NOTE — Op Note (Signed)
[name], Dustin NO.:  [id number]   MEDICAL RECORD NO.:  [id number]          PATIENT TYPE:  INP   LOCATION:  3172                         FACILITY:  [facility]   PHYSICIAN:  Hilda Lias, M.D.   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  Gliosis of the cord.   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  Gliosis of the cord.   PROCEDURE:  Anterior 3-4, 4-5, 5-6, 6-7 diskectomy, decompression of the  spinal cord, bilateral radiculopathy, interbody fusion with allograft  and autograft, plate from E4-V4, microscope.   SURGEON:  Hilda Lias, M.D.   ASSISTANT:  Payton Doughty, M.D.   CLINICAL HISTORY:  [name] is a 61hospital gentleman who came to my  office with his wife.  Incidentally, I did surgery on his wife several  months ago because of cervical myelopathy.  Clinically, he had weakness  of the biceps, triceps and he was unsteady in his gait.  X-ray shows  stenosis from C3-C7 with changes in the spinal cord itself.  Surgery was  advised and the risks were explained in history and physical.   PROCEDURE:  [name] was taken to the OR and after intubation, the  left side of the neck was cleaned with DuraPrep.  Longitudinal incision  through the skin, platysma down to the cervical spine was done.  X-rays  showed that we were at the level of 4-5. From then on, we opened the  anterior ligament and 3-4, 4-5, 5-6 and 6-7.  We brought the microscope  into the area and with the micro curette as well as the drill, we did a  total gross diskectomy.  At the left C3-C4 he had quite a bit of  stenosis with spondylosis.  Decompression, after we opened the posterior  ligament, was done.  At the level C4-C5,  we found a calcified disk at  the midline mostly central and to the left.  Decompression with  bilateral foraminotomy was done.  The same finding was at the level 5-6  and 6-7.  At the end, we had good decompression of the spinal cord at  those four levels plus a good foraminotomy.  Then bone graft, allograft  with autograft in the middle was inserted.  At the level of 3-4, the  height was 6 mm, the other two levels was 7 mm.  This was followed with  a plate using peg screws.  Lateral cervical spine showed good position  of the plate and the screws.  From then on, the area was irrigated.  The  patient had been taking over-the-counter medication and because there  was some oozing, we decided to leave a large drain in the precervical  area.  The wound was closed with Vicryl and Steri-Strips.           ______________________________  Hilda Lias, M.D.     EB/MEDQ  D:  07/04/2007  T:  07/04/2007  Job:  [id number]

## 2010-08-11 NOTE — Op Note (Signed)
NAMEJAMICAH, [name] NO.:  [id number]   MEDICAL RECORD NO.:  [id number]          PATIENT TYPE:  AMB   LOCATION:  ENDO                         FACILITY:  [facility]   PHYSICIAN:  Peter C. Eden Emms, MD, FACCDATE OF BIRTH:  Mar 28, 1946   DATE OF PROCEDURE:  DATE OF DISCHARGE:                               OPERATIVE REPORT   Transesophageal Echo   INDICATIONS:  Question failed segment of the mitral valve.   The patient was sedated with 75-mcg fentanyl and 6 mg of Versed using  digital technique and OmniPlane probe was advanced in the distal  esophagus without incident.  Great care was taken in the position of the  patient's neck since he is status post recent anterior cervical fusion.  Transgastric imaging revealed normal LV function with an EF of 65%.  The  mitral valve was mildly myxomatous.  There was severe prolapse of the  middle to lateral scallop of the posterior leaflet.  I suspect that it  was actually flail, since there was a cord or a mobile density in the  left atrium.  There was severe eccentric mitral insufficiency directed  anteriorly.  The valve appeared eminently repairable.  There was no left  atrial appendage thrombus.  There was mild left atrial enlargement.  Right-sided cardiac chambers were normal.  There was no evidence of  pulmonary hypertension.  There was no ASD.  Aortic valve was trileaflet  and structurally normal.  Imaging of the aorta showed no significant  debris.   FINAL IMPRESSION:  1. Severely prolapsed middle to lateral scallop of the posterior      leaflet, likely flail segment with cord seen mobile in the left      atrium, severe eccentric mitral insufficiency.  2. Normal left ventricular function with ejection fraction of 65%.  3. Normal aortic valve.  4. Normal right-sided cardiac chambers.  5. No left atrial appendage thrombus.  6. No atrial septal defect.   IMPRESSION:  The patient is not having symptoms at this time;  however,  the literature would suggest an early approach to mitral valve repair  with an unsupported or flail segment would be an order since most of the  patients become symptomatic within 2 years.  This would also decrease  the risk of developing atrial fibrillation.  The patient would like to  look into alternatives in regards to minimally invasive surgery and  robotic surgery.  I will see him back in the office in 2-3 weeks, and he  will let me know what he would want to do.  He will need a heart cath 1-  2 weeks prior to any surgery.  The patient's sister has recently died at  the beginning in 08/11/22.  He has had anterior cervical neck surgery and  currently has shingles.  I think a better time frame both physically and  mentally for the patient would be to proceed with surgery in the next 3-  6 months.      Noralyn Pick. Eden Emms, MD, [facility]  Electronically Signed    PCN/MEDQ  D:  08/25/2007  T:  08/26/2007  Job:  [id number]

## 2010-08-11 NOTE — Op Note (Signed)
[name], [name] NO.:  [id number]   MEDICAL RECORD NO.:  [id number]          PATIENT TYPE:  INP   LOCATION:  2308                         FACILITY:  [facility]   PHYSICIAN:  Guadalupe Maple, M.D.  DATE OF BIRTH:  1946-03-23   DATE OF PROCEDURE:  11/07/2007  DATE OF DISCHARGE:                               OPERATIVE REPORT   PROCEDURE:  Intraoperative transesophageal echocardiography.   [name] is a 65 year old white male with a history of mitral  regurgitation and mitral valve prolapse, so he was scheduled to undergo  mitral valve repair by Dr. Cornelius Moras.  Intraoperative transesophageal  echocardiography was requested to evaluate the mitral valve and to  determine if the mechanism of mitral regurgitation and assess for any  other valvular pathology and to assess the left and right ventricular  function.   The patient was brought to the operating room at [facility].  General anesthesia was induced without difficulty.  The trachea was  intubated without difficulty.   IMPRESSION:  Prebypass Findings:  1. Mitral valve.  There was moderate-to-severe mitral regurgitation.      There appeared to be an area of regurgitation resulting from a      flail segment in the P2 area of the posterior leaflet.  There      appeared to be torn chordae present with overriding of the      posterior leaflet and anteriorly directed jet.  The posterior      leaflet also appeared thickened, but the area of prolapse and      regurgitation appeared localized to P2 area.  The anterior leaflet      was thickened and appeared myxomatous and somewhat redundant, but      was not prolapsing and there were no areas of torn chordae noted.      The regurgitant orifice area using the PISA method showed a      regurgitant orifice area of 0.33 cm2.  There was blunted systolic      flow in the left upper pulmonary vein as well.  There was no      significant calcification of the  mitral annulus.  2. Aortic valve.  The aortic valve appeared normal.  The leaflets were      thin and pliable, opened normally.  There was no aortic      insufficiency.  3. Left ventricle.  There appeared to be normal left ventricular      function.  The left ventricular contractility was vigorous and all      segments were interrogated.  Ejection fraction estimated at 60-65%.      Left ventricular wall thickness measured 1.05 cm at the end-      diastole, the mid papillary level of the interventricular septum.  4. Right ventricle.  The right ventricular size was normal.  There was      good contractility of the right ventricular free wall.  5. Tricuspid valve.  The tricuspid valve appeared structurally intact.      There was trace tricuspid insufficiency.  6. Interatrial septum.  The interatrial septum  was intact.  There was      no evidence of patent foramen ovale by bubble study or color      Doppler interrogation.  7. Left atrium.  The left atrium cavity showed no evidence of thrombus      in the left atrium or left atrial appendage.  8. Ascending aorta.  The ascending aorta appeared to be of normal      caliber with well-defined sinotubular region and aortic root, and      no evidence of atheromatous disease present.  9. Descending aorta.  The descending aorta was free of atheromatous      disease and measured 2 cm in diameter.   Postbypass Findings:  1. Mitral valve.  There was a mitral annuloplasty ring present.  The      anterior leaflet was mobile and the posterior leaflet was immobile      and there was no evidence of mitral insufficiency.  2. Aortic valve.  The aortic valve was unchanged from the prebypass      study.  It appeared normal with normal opening and no aortic      insufficiency.  No calcification of the leaflets.  3. Left ventricle.  The left ventricular function again appeared      normal and vigorous with ejection fraction estimated at 65%.  Good       contractility in all segments interrogated.  4. Right ventricle.  The right ventricular function appeared normal.      There was good contractility of the right ventricular free wall.  5. Tricuspid valve.  The tricuspid valve was unchanged from the      prebypass study.  There was trace tricuspid insufficiency, but the      valve otherwise appeared normal.           ______________________________  Guadalupe Maple, M.D.     DCJ/MEDQ  D:  11/07/2007  T:  11/08/2007  Job:  [id number]

## 2010-08-11 NOTE — H&P (Signed)
HISTORY AND PHYSICAL EXAMINATION   November 06, 2007   Re:  [name], [name]       DOB:  April 11, 1945   Date of planned hospital admission November 07, 2007.   The patient returns for further followup related to severe mitral  regurgitation.  The patient was originally seen in consultation on September 18, 2007.  He has been followed for several years with known history of  mitral valve prolapse and mitral regurgitation.  Recent echocardiograms  have confirmed the progression of disease with severe (4+) mitral  regurgitation.  The patient admits to mild exertional shortness of  breath and fatigue.  Transesophageal echocardiogram was performed  confirming the presence of prolapse involving the middle scallop of the  posterior leaflet of the mitral valve with severe (4+) mitral  regurgitation.  There is normal left ventricular size and function.  No  other abnormalities were noted.  The patient was seen in consultation on  September 18, 2007.  Since then, he underwent elective left and right heart  catheterization on October 05, 2007.  This confirmed the presence of normal  coronary artery anatomy with no significant coronary artery disease.  There is severe mitral regurgitation.  Pulmonary artery pressures were  normal, measuring 26/11 with pulmonary capillary wedge pressure of 10,  but [name] large V-wave.  Resting cardiac output is 4.8 liters per minute  corresponding to cardiac index of 2.5.  CT angiogram of the thoracic and  abdominal aorta was also performed.  This confirmed the absence of any  significant atherosclerotic disease involving the thoracic or abdominal  aorta or the iliac vessels.  The patient now returns with tentative  plans to proceed with elective mitral valve repair using minimally  invasive approach.  His review of systems and physical exam remained  unchanged from his last previous exam.  He is not taking any regular  scheduled medications.   I spent in excess  of 30 minutes reviewing the indications, risks, and  potential benefits of surgery with the patient and his family.  The  risks specifically related to use of minimally invasive techniques have  been reviewed.  I believe, there is [name] high likelihood that his valve can  be successfully repaired.  In the very unlikely event that valve repair  cannot be successfully completed, we discussed alternative strategies  with respect to whether to use [name] mechanical prosthesis or bioprosthetic  tissue valve.  He desires to think this over [name] bit before making [name] final  decision, which he will decide upon later today.   The patient and his family understand and accept all potential  associated risks of surgery including, but not limited to risk of death,  stroke, myocardial infarction, congestive heart failure, respiratory  failure, pneumonia, bleeding requiring blood transfusion, arrhythmia,  heart block or bradycardia requiring permanent pacemaker, possible  recurrent right pleural effusion, possible pain or numbness related to  cannulation of femoral vessels, possible vascular injury in femoral  vessels related to groin cannulation.  All of their questions have been  addressed.   [name], M.D.  Electronically Signed   CHO/MEDQ  D:  11/06/2007  T:  11/07/2007  Job:  [id number]   cc:   Noralyn Pick. Eden Emms, MD, [facility]  Georgianne Fick, M.D.

## 2010-08-11 NOTE — Assessment & Plan Note (Signed)
[facility]                            CARDIOLOGY OFFICE NOTE   DARIEN, KADING                    MRN:          [id number]  DATE:08/18/2007                            DOB:          Jul 24, 1945    Mr. Dilks is a referred by Dr. Nicholos Johns for mitral valve disease.   He is 64 years old.  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   He has previously been seen by Dr. Graciela Husbands in our practice.   The patient has had mitral valve prolapse for quite some time.  In fact,  I spent quite a bit of time going back over his records in the chart.  He has had an echocardiogram here in 2004 which showed prolapse and mild  mitral insufficiency.  He had subsequently follow-up echocardiograms in  2006 and 2007 which showed moderate mitral insufficiency.  He is  referred to me because on May 1 he had another echocardiogram which was  read by Dr. Myrtis Ser.  There was moderate prolapse of the posterior leaflet  with some anterior leaflet prolapse with an eccentric jet of moderately  severe MR.  There was a question of a target flashing in the left atrium  in systole and Dr. Myrtis Ser decided he may have a flail segment.  Since the  literature supports a different approach to the patient with a flail  segment in terms of earlier surgery and repair, it was extremely  appropriate for the patient to be referred to cardiology.  The patient  has had some previous symptoms of postural hypotension.  These are no  longer present.  He gets occasional palpitations and has had a PAC on  his EKG here.   He has never had atrial fibrillation.  There has been no previous  history of coronary artery disease.  In fact, he really did not think of  his murmur as anything special until about 4 years ago.  He said at that  time nurses and ancillary medical personnel started to comment on it.   The patient travels a lot.  He does not appear to have any functional  abnormalities.  There is no significant dyspnea, chest pain or  presyncope.  There has been no lower extremity edema.   His dentition is in reasonable shape and he has never had SBE.   His review of systems is remarkable for a recent shingles outbreak on  the right side of his ribs.  They have been fairly painful and he is on  Zovirax for it.   Past medical history is otherwise remarkable for anterior cervical  surgery by Dr. Jeral Fruit, previous orthostatic hypotension with tilt-table  testing by Dr. Graciela Husbands.  Of course, mitral valve prolapse.  Question  hypertriglyceridemia on fish oil.   He has no known allergies.  He is currently on Zovirax for 5 days and  fish oil.   The patient is retired.  He travels a lot, particularly to the  [place].  His wife is with him today.  They seem like a lovely couple.  He has been active without any  limitation.  However, over the last few  months between the shingles and his anterior cervical neck surgery he  has been incapacitated a bit.   He does not smoke and he does not drink.   EXAM:  Remarkable for a thin white male in no distress.  His blood  pressure is 120/74, pulse 69 with occasional PORT-A-CATH, weight 169,  afebrile.  HEENT:  Unremarkable.  Carotids are normal without bruit.  No  lymphadenopathy, thyromegaly or JVP elevation.  LUNGS:  Clear, good diaphragmatic motion.  S1 and S2 with a fairly  classic mitral valve prolapse murmur with MR.  PMI is mildly increased  but not displaced.  ABDOMEN:  Benign.  Bowel sounds positive.  No AAA, no bruit, no  tenderness, no hepatosplenomegaly or hepatojugular reflux.  He has a  shingles outbreak cross the intercostal zones on the right ribs.  LOWER EXTREMITIES:  Have intact pulses, no edema.  NEURO:  Nonfocal.  SKIN:  Warm and dry other than the shingles outbreak.  No muscular  weakness.   EKG shows sinus rhythm with PORT-A-CATH.   The patient's most recent laboratory work shows an LDL of  145, total  cholesterol 204, triglycerides 68.   IMPRESSION:  Mitral valve prolapse.  I spent quite a bit of time with  the patient and his wife.  We talked about the structure the mitral  valve, the regurgitation, the difference between the prolapse and a  flail segment.  We had an extensive talk about further workup including  staged progression through transesophageal echocardiogram, right and  left heart catheterization, and referral to a surgeon   I will have to review his echocardiogram, but given the report he  clearly would fall into a category that would benefit from early repair.   The patient has time.  There is certainly no rush in this.  However,  studies have shown that if a flail segment is present the patients  usually become symptomatic within 2 years and the longer one waits the  less likely a repair can be made.   I explained in great detail to him the procedure of transesophageal  echocardiography.  He has had a previous endoscopy for reflux which  apparently was a bad procedure and it sounds like he was not sedated  enough.  I assured him that we would do a better job with this.  We then  talked at length about the need for right and left heart catheterization  to assess pressures and coronary disease.  Risks including stroke,  bleeding, need for emergency surgery, contrast reaction were discussed.   We then talked about choosing a Careers adviser.  I explained to him that I  thought we had excellent surgeons here in [place] but that I would be  happy to refer him elsewhere, depending on his wishes.  We also talked  little bit about minimally invasive surgery versus regular surgery and  the robotic surgery by Dr. Janey Genta at [facility]  Again, these were  all things he will look into.   For the time being, we will make arrangements to do his transesophageal  echocardiogram in the next week or two and make further recommendations  based on identification of a  flail leaflet or ruptured chordae.   Shingles.  Continues Zovirax.  This clearly would need to be well-healed  before any surgery is done.   LDL cholesterol 145.  Continue fish oil.  Consider starting a statin  drug depending on what we  find at catheterization.   History of postural hypotension, currently benign.   Recent anterior cervical neck surgery.  Follow up with Dr. Jeral Fruit.  Seems to be improving and healing well.   Again, I spent quite a bit of time with the wife and the husband in  regards to all of these issues.  I also explained to him that he has had  a PAC on his electrocardiogram.  He is at much higher risk for atrial  fibrillation if his mitral valve is not corrected.  The long-term risks  of atrial fibrillation, particularly in regards to anticoagulation with  Coumadin, would also favor early surgical intervention, particularly if  a flail segment or ruptured chordae is seen.     Noralyn Pick. Eden Emms, MD, [facility]  Electronically Signed    PCN/MedQ  DD: 08/18/2007  DT: 08/18/2007  Job #: [id number]

## 2010-08-11 NOTE — Assessment & Plan Note (Signed)
OFFICE VISIT   [name], [name]  DOB:  09-24-1945                                        April 01, 2008  CHART #:  [id number]   HISTORY OF PRESENT ILLNESS:  The patient returned for routine followup  status post right miniature thoracotomy for mitral valve repair on  November 07, 2007.  His postoperative recovery has been uneventful.  He  was last seen here in the office on December 08, 2007.  Since then, he  has done well.  He spent approximately 3 months in the [place], and he  has been back here in [place] for the last several weeks.  He has not  had any shortness of breath either with activity or at rest.  He has not  had any tachypalpitations or chest pain.  He has minimal residual  soreness in his right anterolateral chest.  Overall, he feels fine and  has no complaints.  The remainder of his review of systems is entirely  unremarkable.   CURRENT MEDICATION:  Aspirin 81 mg daily.   PHYSICAL EXAMINATION:  GENERAL:  Notable for [name] well-appearing male.  VITAL SIGNS:  Blood pressure 113/75, pulse 82, and oxygen saturation  96%.  CHEST:  [name] right miniature thoracotomy incision has healed nicely.  Breath sounds are clear to auscultation and symmetrical bilaterally.  No  wheezes or rhonchi are noted.  CARDIOVASCULAR:  Demonstrates regular rate and rhythm.  No murmurs,  rubs, or gallops are noted.  ABDOMEN:  Soft, nontender.  EXTREMITIES:  Warm and well perfused.  There is no lower extremity  edema.  The remainder of his physical exam is unremarkable.   IMPRESSION:  The patient is doing quite well.  He has not had an  echocardiogram since his surgery last August.   PLAN:  I have suggested the patient that at some point in the next few  months, it would be reasonable to check [name] followup echocardiogram  through Dr. Fabio Bering office.  Presuming that  there are no unusual findings, then in the future, he will call or  return to see [place] here at [facility] only should  further problems or difficulties arise.   Salvatore Decent. Cornelius Moras, M.D.  Electronically Signed   CHO/MEDQ  D:  04/01/2008  T:  04/01/2008  Job:  [id number]   cc:   Noralyn Pick. Eden Emms, MD, [facility]  Georgianne Fick, M.D.

## 2010-08-11 NOTE — Assessment & Plan Note (Signed)
OFFICE VISIT   [name], [name]  DOB:  1945-05-11                                        November 20, 2007  CHART #:  [id number]   HISTORY OF PRESENT ILLNESS:  The patient returns for follow-up  approximately 2 weeks following right miniature thoracotomy for mitral  valve repair.  The patient did extremely well postoperatively and was  discharged to home postop day #4 in stable condition.  He states that he  continued to do extremely well until two days ago when he developed  dizziness when attempted standing.  He felt very weak and overall just  did not feel right.  He states that when attempting to take his pulse,  he noted it was irregular and fast.  He had called Cardiology and talked  to Dr. Fabio Bering PA.  At this time, Dr. Fabio Bering PA told him to go to the  emergency room for further evaluation.  The patient did not like that  idea and knew that he had appointment with Dr. Cornelius Moras on Monday.  The  patient today still feels about the same.  Prior to Saturday, he was  doing extremely well.  He is walking without difficulty.  Incisional  pain was minimal.  He is tolerating normal diet well.  He denied any  nausea, vomiting, opening, or drainage from any of his incision sites.  He has not had shortness of breath.   PHYSICAL EXAMINATION:  VITAL SIGNS:  Blood pressure 101/64, pulse is  irregular, respiratory rate is 18, O2 sats 99% on room air.  RESPIRATORY:  Clear to auscultation bilaterally.  CARDIAC:  Irregularly irregular.  No murmurs noted.  EKG strip done  showing rapid atrial fibrillation.  ABDOMEN:  Benign.  Soft, nontender.  EXTREMITIES:  Warm.  The patient has 2+ bilateral DP pulses noted.  INCISIONS:  All incisions are clean, dry, and intact and healing well.  Two Vicryl stitches were removed from chest tube sites.   STUDIES:  The patient had AP and lateral chest x-ray done today showing  [name] small right pleural effusion.  EKG strips done today  showing rapid  atrial fibrillation.   IMPRESSION AND PLAN:  The patient is status post right miniature  thoracotomy with mitral valve repair.  The patient seems to be  progressing well, but unfortunately on EKG strip today, he is noted to  be in rapid atrial fibrillation.  The patient has probably been in this  since Saturday.  He was seen and evaluated by Dr. Cornelius Moras.  Dr. Cornelius Moras called  Dr. Fabio Bering office to discuss with Dr. Eden Emms admitting the patient for  treatment of atrial fibrillation versus outpatient medical therapy.  Dustin  [name] hopes to avoid hospitalization if possible.  The patient did  state that his last Coumadin check was on Thursday and INR was 1.7.  His  coumadin dose was increased at that time.  The patient does have an  appointment with Dr. Eden Emms on Wednesday as well as [name] PT/INR blood work.  The patient is to keep this appointment.  He was given [name] prescription  for Amiodarone 400 mg oral twice daily plus digoxin 0.125 mg oral once  daily to begin today.  The patient is instructed no driving till heart  rate arrhythmia is resolved as well as no significant heavy lifting.  He  is to continue ambulating.  We will plan to see the patient back in 3  weeks with [name] chest x-ray.  He is to contact us if he has any surgical  issues during the interim.  The patient is in agreement.   Salvatore Decent. Cornelius Moras, M.D.  Electronically Signed   KMD/MEDQ  D:  11/20/2007  T:  11/21/2007  Job:  [id number]   cc:   Noralyn Pick. Eden Emms, MD, [facility]  Salvatore Decent. Cornelius Moras, M.D.

## 2010-08-11 NOTE — H&P (Signed)
NAME:  [name], [name] NO.:  [id number]   MEDICAL RECORD NO.:  [id number]          PATIENT TYPE:  INP   LOCATION:  3034                         FACILITY:  [facility]   PHYSICIAN:  Hilda Lias, M.D.   DATE OF BIRTH:  21-Oct-1945   DATE OF ADMISSION:  07/04/2007  DATE OF DISCHARGE:                              HISTORY & PHYSICAL   [name] is a gentleman who came to my office complaining of neck  pain with burning  radiating down to both upper extremity, losing his  balance, and also losing control of both legs.  He also complains of  pain going to the right leg.  He had some type of surgery to the lumbar  spine many years ago.  At the present time, he denies any problem with  bowel or bladder.   PAST MEDICAL HISTORY:  1. Back surgery.  2. Eye surgery in 2006.  3. Cataracts surgery in 2003 as well as repair of the left shoulder.   He is not allergic to medication.  The patient does not smoke.  He  drinks wine.   FAMILY HISTORY:  Unremarkable.   REVIEW OF SYSTEMS:  Positive for incoordination, neck pain, back pain.   MEDICATIONS:  He is taking Celebrex and fish oil.   PHYSICAL EXAMINATION:  HEENT:  Normal.  NECK:  He has a decreased flexibility of cervical spine.  LUNGS:  Clear.  HEART:  Sounds normal.  ABDOMEN:  Normal.  EXTREMITIES:  There is a scar in the left shoulder.  NEURO:  Currently normal strength.  He can very easily pull both biceps  and both wrist extensors.  Sensation is normal although he complains of  burning sensation in both arms.  Reflexes are 1+ at the upper  extremities and 2+ in the lower extremities.  When he tried to walk in a  straight line, he is off balance.   The cervical spine x-ray as well as the MRI showed degenerative disk  disease and stenosis with gliosis at the L4-5.   IMPRESSION:  Cervical myelopathy secondary to stenosis from C3 down to  C7.   RECOMMENDATIONS:  The patient is being prepared for surgery, and the  procedure is going to be four level anterior cervical diskectomy.  He  knows about the risks such as infection, paralysis, hematoma, need of  further surgery, and no improvement whatsoever.           ______________________________  Hilda Lias, M.D.     EB/MEDQ  D:  07/04/2007  T:  07/04/2007  Job:  [id number]

## 2010-08-11 NOTE — Op Note (Signed)
NAMEISOM, KOCHAN NO.:  [id number]   MEDICAL RECORD NO.:  [id number]          PATIENT TYPE:  INP   LOCATION:  2308                         FACILITY:  [facility]   PHYSICIAN:  Salvatore Decent. Cornelius Moras, M.D. DATE OF BIRTH:  1945/08/01   DATE OF PROCEDURE:  DATE OF DISCHARGE:                               OPERATIVE REPORT   PREOPERATIVE DIAGNOSIS:  Mitral valve prolapse with severe mitral  regurgitation.   POSTOPERATIVE DIAGNOSIS:  Mitral valve prolapse with severe mitral  regurgitation.   PROCEDURE:  Right miniature thoracotomy for mitral valve repair  (triangular resection of posterior leaflet with 28-mm Edwards Physio II  ring annuloplasty)   SURGEON:  Salvatore Decent. Cornelius Moras, MD   ASSISTANT:  Doree Fudge, PA   ANESTHESIA:  General.   BRIEF CLINICAL NOTE:  The patient is a 65 year old gentleman from  [place] with known history of mitral valve prolapse and mitral  regurgitation who has been followed by Dr. Charlton Haws for several  years.  The patient now describes mild exertional shortness of breath.  Echocardiogram demonstrates mitral valve prolapse with what appears to  be flail segment of the posterior leaflet of the mitral valve and severe  (4+) mitral regurgitation.  Transesophageal echocardiogram confirmed  severe mitral regurgitation.  Left and right heart catheterization  demonstrates normal coronary artery anatomy with no significant coronary  artery disease.  A full consultation note has been dictated previously.  The patient and his wife have been counseled at length regarding the  indications, risks, and potential benefits of surgery.  Alternative  treatment strategies have been discussed.  All their questions have been  addressed.   OPERATIVE FINDINGS:  1. Flail middle scallop (P2) of the posterior leaflet of the mitral      valve with severe mitral regurgitation.  2. Normal left ventricular size and function.  3. No residual mitral  regurgitation following successful mitral valve      repair.   OPERATIVE NOTE IN DETAIL:  The patient is brought to the operating room  on the above-mentioned date and central monitoring is established by the  Anesthesia Service under the care and direction of Dr. Kipp Brood.  Specifically, a Swan-Ganz catheter is placed through the left internal  jugular approach.  A radial arterial line is placed.  Intravenous  antibiotics are administered.  The patient is placed in the supine  position on the operating table.  General endotracheal anesthesia is  induced uneventfully.  Initially, a dual-lumen endotracheal tube is  placed to facilitate single lung ventilation.  A Foley catheter is  as  placed.   Baseline transesophageal echocardiogram is performed by Dr. Noreene Larsson.  This confirms the presence of mitral valve prolapse with what appears to  be a flail middle scallop of the posterior leaf of the mitral valve and  severe mitral regurgitation.  Left ventricular size and function are  normal.  There are no other abnormalities appreciated.   The patient is positioned with the head turned towards the left side and  a rolled sheet behind the right scapula and right side.  The patient's  right neck, chest,  both groins, and both lower extremities are prepared  and draped in sterile manner.   A small oblique incision is made in the right inguinal crease.  The  right common femoral artery and vein are dissected from associated  structures and carefully examined.  Dissection is performed only on the  anterior surface of both the artery and the vein.   A right miniature thoracotomy incision is performed beginning just  lateral to the right nipple.  Total length of the incision is 4.5 cm.  Incision is completed through the subcutaneous tissues with  electrocautery.  Single-lung ventilation is begun.  The right chest is  entered through the fourth intercostal space.  A separate stab incision  is  placed in the anterior axillary line inferiorly and an 11-mm port is  passed through the stab incision into the right pleural space.  Carbon  dioxide gas is then insufflated into the right chest for the duration of  the operation.  Soft tissue retractors placed through the thoracotomy  incision.  The right chest is explored.  Visibility appears to be  satisfactory.  A 2-0 Ethibond pledgeted sutures placed through the  central portion of the diaphragm and retracted inferiorly to retract the  diaphragm away.  A longitudinal incision is made in the pericardium 2 cm  anterior to the phrenic nerve.  Exposure is felt to be excellent.   The patient is heparinized systemically.  The right common femoral vein  is cannulated using the Seldinger technique after placement of CD-4 Gore-  Tex pledgeted sutures on the anterior surface of the femoral vein and  femoral artery for cannulation.  A flexible guidewire is passed using  transesophageal echocardiographic guidance up through the inferior vena  cava through the right atrium into the superior vena cava.  The femoral  vein is then dilated with serial dilators, and a 22 x 25 long femoral  venous cannula is then advanced over the guidewire through the inferior  vena cava and right atrium until the tip is extending into the superior  vena cava under direct vision using transesophageal echocardiographic  guidance.  The right internal jugular vein is now cannulated using the  Seldinger technique.  A 16-French femoral cannula is advanced over  serial dilators into the right superior vena cava for dual venous  drainage.  Finally, the right femoral artery is cannulated use using the  Seldinger technique, and a 19-French long femoral arterial cannula is  advanced into the common iliac artery.  Adequate heparinization is  verified.   Cardiopulmonary bypass is begun.  The incision in the pericardium is  extended in both directions to facilitate complete  exposure of the right  lateral margin of the heart.  The 2-0 silk pericardial retraction  sutures are then placed to retract the pericardium both inferiorly and  superiorly.  The undersurface of the aorta is dissected away from the  right pulmonary artery to facilitate subsequent cross-clamp placement.  The 4-0 Prolene sutures are placed in the anterolateral surface of the  ascending aorta just above the right atrial appendage for placement of  antegrade cardioplegic cannula.  A 4-0 Prolene suture is placed in the  right atrium, and a retrograde cardioplegic catheter is placed through  the right atrium into the coronary sinus using transesophageal  echocardiogram guidance.  Appropriate placement of the retrograde  cannula is verified by monitoring continuous pressure waveform of the  coronary sinus catheter.  An antegrade cardioplegia needle is then  placed directly into the  ascending aorta and secured in place with the  pledgeted Prolene sutures.   The patient is cooled to 28 degrees systemic temperature.  The aortic  cross-clamp is applied and cold blood cardioplegia is administered  initially in antegrade fashion through the aortic root.  The initial  cardioplegic arrest and myocardial end-diastolic arrest is excellent.  Supplemental cardioplegia is administered retrograde through the  coronary sinus catheter.  Repeat doses of cardioplegia are administered  intermittently throughout the cross-clamp portion of the operation  retrograde through the coronary sinus catheter to maintain completely  isoelectric electrocardiogram.   Left atriotomy incision is performed posteriorly through the interatrial  groove.  Left atrial retractor is then placed and attached to a side arm  placed through a separate stab incision just to the right side of the  sternum to the fourth intercostal space.  The mitral valve is exposed.  Exposure is felt to be satisfactory.  The mitral valve is carefully   examined.  There is a single primary cord from the middle scallop (P2)  of the posterior leaflet which is ruptured and flannel.  The middle  scallop is severely prolapsed.  The remainder of the valve appears  fairly normal without any other significant areas of prolapse.  The  valve is not large and there is not any redundant tissue, with findings  all consistent with fibroelastic deficiency.   Interrupted 2-0 Ethibond horizontal mattress sutures are placed  circumferentially around the mitral annulus to serve as annuloplasty  ring sutures and to facilitate subsequent exposure.  Once these sutures  are placed circumferentially around the entire valve, valve is again  carefully analyzed.  A small triangular resection of the flail segment  of the middle scallop (P2) is performed.  The intervening vertical  defect in the posterior leaflet is then closed using interrupted CV-6  everting Gore-Tex sutures.  The valve appears to be competent at this  juncture.   The valve is sized to accept a 28-mm annuloplasty ring.  This  corresponds precisely to the dimensions of the anterior leaflet of the  mitral valve.  A 28-mm Edwards Physio II annuloplasty ring (model #[id number],  serial [id number]) is secured in place uneventfully.  The valve is tested  by instilling saline into the left ventricular chamber, and the valve  appears to be perfectly competent with no residual mitral regurgitation.   Rewarming is begun.  Left ventricular vent is placed across the mitral  valve into the left ventricular chamber.  The left atriotomy incision is  then closed using a two-layer closure of running 3-0 Prolene suture.  A  single pledgeted stitch is placed around left ventricular vent and  placed on a Rumel tourniquet to facilitate subsequent removal later.  An  epicardial pacing wire is fixed to the right ventricular free wall  inferiorly.  The pericardial space is drained using a 28-French Bard  drain exited through  a separate stab incision anteriorly.  The patient  is placed in Trendelenburg position.  One final dose of warm retrograde  hot shot cardioplegia is administered.   The aortic cross-clamp is removed after total cross-clamp time of 126  minutes.  The heart began to beat spontaneously without need for  cardioversion.  The retrograde and antegrade cardioplegic cannulas are  removed.  Epicardial pacing wires are fixed to the right atrial  appendage.  The patient is rewarmed to 37 degrees centigrade  temperature.  The lungs are now ventilated and heart allowed to fill to  evacuate  any residual air.  The left ventricular vent is then removed  and the Prolene suture secured.  The patient is weaned from  cardiopulmonary bypass without difficulty.  The patient's rhythm at  separation from bypass is a junctional rhythm.  AV sequential pacing is  employed.  Total cardiopulmonary bypass time for the operation is 176  minutes.  Normal sinus rhythm resumed spontaneously following separation  from bypass and atrial pacing is employed.  No inotropic support is  required.   Followup transesophageal echocardiogram performed by Dr. Noreene Larsson after  separation from bypass demonstrates well seated annuloplasty ring in the  mitral position with normal-appearing mitral valve and no residual  mitral regurgitation.  There is normal left ventricular function.  There  is no significant residual air.   Protamine is administered to reverse the anticoagulation.  The right  internal jugular cannula is removed, and cannulation site oversewn with  a single everting mattress silk suture over Rumel buttress.  The  inferior vena cava cannula is removed, and the femoral venous  cannulation site is closed by tying the pledgeted Gore-Tex suture.  The  femoral arterial cannula is removed, and the cannulation sutures  secured.  There is an excellent pulse in the distal femoral artery.  Meticulous hemostasis is ascertained.    The right chest is irrigated with saline solution.  Meticulous  hemostasis is ascertained.  The On-Q continuous pain management system  is utilized to facilitate postoperative pain control.  One 5-inch  catheter supplied with the On-Q kit is tunneled initially through the  subcutaneous tissues to the right inferior port incision and then  subsequently tunneled into the subpleural space to cover the second  through the fifth intercostal nerve roots posteriorly.  The catheter is  flushed with 5 mL of 0.5% bupivacaine solution and ultimately connected  to a continuous infusion pump.  The right pleural space is drained with  a 28-French Bard drain placed through the incision.  The right miniature  thoracotomy incision is closed in multiple layers, and the skin is  closed with a subcuticular skin closure.  The groin incision is inspect  for hemostasis and closed in multiple layers, and skin incision closed  with subcuticular skin closure.   The patient tolerated the procedure well and is transported to the  Surgical Intensive Care Unit after reintubation using a single-lumen  endotracheal tube by Dr. Noreene Larsson and colleagues.  There are no  intraoperative complications.  All sponge, instrument, and needle counts  are verified correct at completion of the operation.  No blood products  are administered.      Salvatore Decent. Cornelius Moras, M.D.  Electronically Signed     CHO/MEDQ  D:  11/07/2007  T:  11/08/2007  Job:  [id number]   cc:   Noralyn Pick. Eden Emms, MD, [facility]  Georgianne Fick, M.D.

## 2010-08-11 NOTE — Assessment & Plan Note (Signed)
[facility]                            CARDIOLOGY OFFICE NOTE   [name], [name]                    MRN:          [id number]  DATE:01/12/2008                            DOB:          04-02-1945    [name] returns today for followup.  He is status post mitral valve  repair by Dr. Cornelius [name].   His surgery, I believe, was done in August.  Prior to surgery, he had  difficulty with a zoster infection.  He also had a cervical laminectomy,  which was somewhat slow to heal.  We had to postponed his mitral valve  surgery for a little bit, while his skin infections healed.  His  perioperative course was complicated by paroxysmal atrial fibrillation.  We stopped his Coumadin and did an amiodarone at the end of September.  He is doing well.  He has been vigorously exercising at cardiac rehab  without difficulty.  He still gets occasional weakness and feels like  his blood pressure falls low.  He has not had any frank syncope or  recurrent evidence of atrial fibrillation.  He only medications  currently include baby aspirin.  He was enquiring about taking Tylenol,  Viagra, and fish oil.  I told him these would all be fine.  He did not  have concomitant coronary artery disease.  He is planning a 7-week trip  to [facility] and I told him he is free to do this, and I wish I was going  with him.   ALLERGIES:  He has no known allergies.   PHYSICAL EXAMINATION:  GENERAL:  Remarkable for healthy-appearing, thin  white male in no distress.  He has an earring in the left ear.  VITAL SIGNS:  His weight is 166; blood pressure 120/70; pulse 78 and  regular; and respiratory rate 14, afebrile.  HEENT:  Unremarkable.  He has a anterior cervical scar, which is well  healed.  NECK:  No carotid bruits.  No lymphadenopathy, thyromegaly, or JVP  elevation.  LUNGS:  Clear, good diaphragmatic motion.  No wheezing.  Previous zoster  infection on the right side is healed with  minimal hyperpigmentation.  S1 and S2 without MR murmur anymore.  ABDOMEN:  Benign.  Bowel sounds positive.  No AAA.  No tenderness.  No  bruit.  No hepatosplenomegaly or hepatojugular reflux.  EXTREMITIES:  Distal pulses were intact.  There is no bruit over the cannulation site  of the minimally invasive mitral valve surgery in the right groin.   IMPRESSION:  1. Stable mitral valve repair.  Followup echo in a year.  No      significant murmurs.  2. Paroxysmal atrial fibrillation, maintaining sinus rhythm.  Continue      aspirin.  3. Zoster infection healed.  Followup dermatologist as needed.  4. Cervical laminectomy, stable.  Follow up orthopedics as needed.  5. General health maintenance.  He has had a flu shot.  I do not think      he will be here for his H1N1 shot as he is leaving for [place]      this week.  I will see him back in 6 months.    [name]. Dustin Emms, MD, [facility]  Electronically Signed   PCN/MedQ  DD: 01/12/2008  DT: 01/13/2008  Job #: [id number]

## 2010-08-11 NOTE — Assessment & Plan Note (Signed)
[facility]                                 ON-CALL NOTE   JSHAUN, ABERNATHY                    MRN:          [id number]  DATE:11/18/2007                            DOB:          06/02/1945    PRIMARY CARDIOLOGIST:  Noralyn Pick. Eden Emms, MD, [facility].   SUPERVISING CARDIOLOGIST:  Luis Abed, MD, [facility].   SURGEON:  Salvatore Decent. Cornelius Moras, M.D.   BRIEF HISTORY:  Mr. Kuba is a 65 year old male who was recently  discharged on November 10, 2007 after he underwent a right  minithoracotomy, a mitral valve repair with a ring annuloplasty by Dr.  Cornelius Moras on November 07, 2007.  He stated that he has done well since surgery.  However, on the morning of initial phone call, he stated that he was  very dizzy when he would stand up and feels faint.  He stated that this  has happened to him in the past, and he has always increased his  hydration and he has felt better.  He did not have the capability of  checking his blood pressure.  He also states that his heart rate  particularly with standing becomes increasingly fast.   I informed Mr. Rosengren that the only way to specifically evaluate him on  Saturday would be for him to come to the emergency room, as it sounded  like he was possibly dehydrated and needed fluid, but without the  specific blood pressures and heart rate,  it was difficult to tell.  Mr.  Nipp after discussing with his wife felt that he would try to hydrate  himself at home.  His wife also went out and purchased a blood pressure  cuff.  At time of followup on Saturday afternoon, he told me that his  blood pressures earlier in the morning had been, lying 112/93, sitting  91/61 and standing 78/66.  However, after hydrating later in the  afternoon, his blood pressures did not significantly change with  standing, and he was no longer having any problems with dizziness.  He  states that his heart rate remains somewhat fast.   I also checked with him on  Sunday morning, and he states that he feels  much better and his blood pressures have stabilized.  However, his heart  rate continues to remain fast went into 100s and 120s.  He states that  he feels that it is regular.  I offered him to come to the emergency  room to have an EKG done for further assessment.  However, he states  that since he is feeling fine, his blood pressures have stabilized, he  will review everything with Dr. Cornelius Moras in the office on Monday, and ask  for an EKG to be done.  He is also scheduled to see Dr. Eden Emms on  Wednesday, November 22, 2007, and we will review the findings at that time  as well.  I asked  him to please call us back, if he had any symptomatic palpitations or if  he should develop any further blood pressure problems or symptoms of  dizziness.  He  stated that he would do so.      Joellyn Rued, PA-C  Electronically Signed      Luis Abed, MD, [facility]  Electronically Signed   EW/MedQ  DD: 11/19/2007  DT: 11/19/2007  Job #: [id number]   cc:   Salvatore Decent. Cornelius Moras, M.D.

## 2010-08-14 NOTE — Op Note (Signed)
   NAME:  AZAHEL, BELCASTRO NO.:  [id number]   MEDICAL RECORD NO.:  [id number]                   PATIENT TYPE:  OIB   LOCATION:  2854                                 FACILITY:  [facility]   PHYSICIAN:  Duke Salvia, M.D. [facility]           DATE OF BIRTH:  05-Feb-1946   DATE OF PROCEDURE:  06/05/2002  DATE OF DISCHARGE:  06/05/2002                                 OPERATIVE REPORT   PREOPERATIVE DIAGNOSES:  1. Syncope.  2. Orthostatic hypotension.   POSTOPERATIVE DIAGNOSES:  1. Syncope.  2. Orthostatic hypotension.   PROCEDURE:  Head-up tilt table testing under orthostatic protocol.   DESCRIPTION OF PROCEDURE:  First the patient was equilibrated in the supine  position with blood pressures at approximately 120/70 with a pulse of 60.  He was elevated through graduated tilting at 15, 30, 45 degrees each for  five minutes, with a gradual decrease in his systolic blood pressure through  45 degrees at 105 mm.  We went upright to 70 degrees.  There was continued  fall in his blood pressure with only a small increase in his heart rate as  his blood pressure fell to 96/60 with a pulse of 73.  At that point he had a  paradoxical bradycardic reaction with his heart rate going to 57, with  further decrease in his blood pressure.   IMPRESSION:  1. Orthostatic hypotension.  2. Vasodepressor/cardioinhibitory syncope on top of #1.                                               Duke Salvia, M.D. [facility]    SCK/MEDQ  D:  06/29/2002  T:  07/02/2002  Job:  [id number]   cc:   Electrophysiology Laboratory

## 2010-08-14 NOTE — Consult Note (Signed)
[name], [name]             ACCOUNT NO.:  [id number]   MEDICAL RECORD NO.:  [id number]          PATIENT TYPE:  INP   LOCATION:  1521                         FACILITY:  [facility]   PHYSICIAN:  Toby L. Fugate, D.O.   DATE OF BIRTH:  1945-06-26   DATE OF CONSULTATION:  DATE OF DISCHARGE:                                   CONSULTATION   REQUESTING PHYSICIAN:  Jene Every, M.D.   REASON FOR CONSULTATION:  Syncope.   HISTORY OF PRESENT ILLNESS:  [name] is a 65 year old Caucasian male who  was admitted to [facility] on May 19, 2005 to undergo spinal  decompression.  He tolerated the procedure well.  Today, after going to the  bathroom to urinate, he had a syncopal episode.  He awoke on the floor of  the bathroom.  Nursing did come to assist him.  He did pass out once more on  the way back to the bed.  He says that when he awoke, his memory was  somewhat clouded, but overall, he was in his normal state of mind.  There  was no loss of bowel or bladder function.  There was no report of any  seizure-like activity.  Interestingly,  this patient has been diagnosed in  the past with neurocardiogenic syncope.  He also has mitral valve prolapse,  for which he is followed by [facility].  His last syncopal episode  was about two years ago.  On a regular basis, he watches his fluid intake  and occasionally takes salt tablets to help with the syncope.  Currently, he  is completely stable.  He denies any dizziness or lightheadedness.  In  addition, he denies chest pain, shortness of breath, GI symptoms or  weakness.  He also denies fevers or chills.  He did hit his forehead today.  A CT scan of the head was negative.   PAST MEDICAL HISTORY:  1.  History of neurocardiogenic syncope.  2.  Mitral valve prolapse.  3.  Ruptured lumbar disk, now status post decompression.  4.  Spinal stenosis.   MEDICATIONS:  1.  Ascorbic acid 500 mg p.o. daily.  2.  Cefazolin 1 gm IV  q.8h.  3.  Docusate sodium 100 mg p.o. b.i.d.  4.  Lactated Ringer's.  5.  Vicodin every 4 hours p.r.n. pain.  6.  Ibuprofen 200 mg p.o. every 6 hours p.r.n.  7.  Robaxin 500 mg p.o. q.6h. as needed.  8.  Morphine sulfate 2 mg IV every 2 hours as needed.   ALLERGIES:  No known drug allergies.   SOCIAL HISTORY:  He denies tobacco and IV drug abuse.  He does drink wine  occasionally.  He is an Archivist.   FAMILY HISTORY:  Mother is still living.  She did have a stroke in the past.  Father is still living and is healthy.   REVIEW OF SYSTEMS:  A complete 12-point review of systems was obtained.  The  review was negative except for that stated in the HPI.   PHYSICAL EXAMINATION:  VITAL SIGNS:  Temperature 97.5, pulse 74, respiratory  rate 18, blood pressure 111/67.  HEENT:  Pupils are equal, round and reactive to light.  Extraocular muscles  are intact.  There is no scleral icterus.  Oropharynx is clear and moist.  No erythema or thrush.  NECK:  No JVD.  No carotid bruits.  No adenopathy.  LUNGS:  Clear to auscultation bilaterally.  No wheezes, rales or rhonchi.  HEART:  Regular rate and rhythm.  There what appears to be a systolic  ejection murmur.  ABDOMEN:  Positive bowel sounds.  Nontender, nondistended.  EXTREMITIES:  No clubbing, cyanosis or edema.  NEUROLOGIC:  Cranial nerves II-XII are grossly intact.  There were no focal  deficits.  DTRs are 2/4 in all extremities.  Strength is 5/5 in all  extremities.   LABS:  Hemoglobin is 13.9, hematocrit 40.1, (These levels were from May 14, 2005), otherwise there are no pertinent labs on the chart.   As stated above, CT of the head without contrast today was unremarkable.   ASSESSMENT/PLAN:  1.  Probable neurocardiogenic syncope:  I would request that the patient be      transferred to a telemetry bed for monitoring.  He has already had an      EKG, which showed a normal sinus rhythm.  I will also request that       orthostatic vitals be obtained.  I will order a complete metabolic panel      and troponins x3.  Likely, this event is related to the neurocardiogenic      syncope that he was diagnosed with in the past.  He is followed closely      by [facility].  I think it would be reasonable to ask them to      come by to see him as well.  2.  Status post spinal decompression:  Continue pain management, as per      surgery.      Toby L. Fugate, D.O.  Electronically Signed     TLF/MEDQ  D:  05/20/2005  T:  05/21/2005  Job:  [id number]

## 2010-08-14 NOTE — Op Note (Signed)
[name], [name] NO.:  [id number]   MEDICAL RECORD NO.:  [id number]          PATIENT TYPE:  AMB   LOCATION:  DAY                          FACILITY:  [facility]   PHYSICIAN:  Jene Every, M.D.    DATE OF BIRTH:  23-Oct-1945   DATE OF PROCEDURE:  05/19/2005  DATE OF DISCHARGE:                                 OPERATIVE REPORT   PREOPERATIVE DIAGNOSES:  1.  Spinal stenosis, L4-5, L5-S1.  2.  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   PREOPERATIVE DIAGNOSES:  1.  Spinal stenosis, L4-5, L5-S1.  2.  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   PROCEDURE:  1.  Decompression L4-5, L5-S1, right.  2.  Foraminotomies at L5 and S1.  3.  Microdiskectomy, L5-S1.  4.  Hemilaminectomy of L5.  5.  Hemilaminotomy of L4.   ANESTHESIA:  General.   SURGEON:  Dr. Shelle Iron   ASSISTANT:  Dr. Darrelyn Hillock   BRIEF HISTORY AND INDICATIONS:  This 65 year old gentleman has had  refractory right lower extremity radicular pain with a mixed nerve root  distribution of L5 and S1.  He has underlying congential stenosis, lateral  recess stenosis at 4-5 and at 5-1; he had a small disk herniation at 5-1,  compressing the S1 nerve root out into the foramen.  He had foraminal  stenosis secondary to facet hypertrophy.  He had been refractory to  conservative treatment, EHL weakness, positive neural tension sign,  diminished planter flexion, and he is indicated for decompression at 5-1,  foraminotomy at 5, microdiskectomy L5-S1, as well as decompression at 4-5  and possible hemilaminectomy.  He had predominantly leg pain, minimal back  pain.  No instability on flexion and extension, slight __________ at 5-1.  Minor arthritic changes in the hip that were asymptomatic.  Discussed risks  and benefits of the procedure including bleeding, infection, damage to  vascular structures, CSF leakage, epidural fibrosis, adjacent segment  disease, need for fusion in the future, anesthetic complications, DVT, PE,  etc., worsening of symptoms, no change in symptoms, etc.   TECHNIQUE:  The patient in supine position.  After the induction of adequate  general anesthesia and 1 g of Kefzol and placement of Foley, he was placed  prone on the [place] frame.  All bony prominences well-padded.  Abdomen and  genitals were free.  Lumbar region is prepped and draped in the usual  sterile fashion.  Two 18 gauge spinal needles utilized to localize the 4-5  and 5-1 interspace, confirmed with an x-ray.  An incision was made from the  spinous process 4 to S1.  Subcutaneous tissue was dissected.  Electrocautery  was utilized to achieve hemostasis.  The dorsolumbar fascia identified and  divided in line with the skin incision.  Paraspinous muscle elevated from  the lamina of 4-5 and S1.  McCullough retractor is placed, confirmatory  radiograph obtained with Penfield 4 in the underlying space at 4-5 and at 5-  1.  This confirmed with x-ray.  First the ligamentum flavum detached from  the cephalad edge of S1 utilizing a straight curette and a 2 mm Kerrison,  performing a  foraminotomy of S1.  Next the Leksell was utilized to remove  the undersurface of the lamina of 5 and then to detach the ligamentum flavum  from the cephalad attached at L5.  After this was performed, there was a  small window noted where a small area of lamina was remaining.  Ligamentum  flavum removed from the L5-S1 interspace with adhesions noted to the dura.  Ligamentum flavum was meticulously removed without trauma to the thecal sac.  With the operating microscope, we could see significant lateral recess  stenosis secondary to disk herniation, ligamentum flavum hypertrophy, and an  osteophytic spur off the facet.  We decompressed the lateral recess and  medial border of the pedicle.  This was with a 2 mm Kerrison.  Performed a  foraminotomy of 5, fully decompressed the 5 root.  We carried the  decompression up further, and we felt that given the  significant stenosis of  the adjacent level that it would probably require resection of the hemi  lamina on the right.  We therefore detached the ligamentum flavum from the  caudad edge of 4, as well completed the hemilaminectomy of 5, removed the  ligamentum flavum from the interspace at 4-5, were significant ligamentum  flavum hypertrophy and lateral recess stenosis.  This was decompressed out  to the medial part of the pedicle.  There were adhesions noted throughout  epidural venous plexus, and bipolar electrocautery was utilized to achieve  hemostasis.  The 5 root origin was identified and followed out into the  foramen of 5.  The disk at 5-1 was entered with an annulotomy.  A copious  portion of disk material was removed with a straight and upbiting pituitary  and out into the subarticular region.  We then examined the foramen of 5.  It was found to be widely patent.  There was good room for the 5 root,  passed a hockey-stick freely out into the 5 foramen without stenosis noted  out laterally.  There was no residual disk herniation noted.  The S1 of 5  roots were clear.  It was felt we had adequate decompression of 5 and S1  nerve roots.  Bone wax was placed on the cancellous surfaces.  I copiously  irrigated the disk space with antibiotic irrigation.  Inspection revealed no  evidence of CSF leakage or active bleeding.  I passed hockey-stick probe  underneath the lamina of 4 out the foramen of 4-5 and S1, and they were  found to be widely patent.  We had achieved satisfactory decompression.  Next, thrombin-soaked Gelfoam was placed in the interlaminar defect.  The  McCullough retractor was removed.  Paraspinous muscles inspected with no  evidence of active bleeding.  Copious irrigation of the paraspinous  musculature and the subcutaneous tissue was performed.  Subcutaneous  reapproximated with #1 Vicryl interrupted figure-of-eight sutures. Subcutaneous tissue reapproximated with 2-0  Vicryl simple sutures.  Skin was  reapproximated with staples, the wound dressed sterilely.  He was placed  supine on the hospital bed, extubated without difficulty, and transported to  the recovery room in satisfactory condition.   The patient tolerated the procedure well with no apparent complications.      Jene Every, M.D.  Electronically Signed     JB/MEDQ  D:  05/19/2005  T:  05/20/2005  Job:  [id number]

## 2010-12-22 LAB — BASIC METABOLIC PANEL
CO2: 28
Chloride: 106
GFR calc Af Amer: >60
Sodium: 139

## 2010-12-22 LAB — CBC
Hemoglobin: 14.5
MCHC: 34.1
MCV: 93.3
RBC: 4.56

## 2010-12-24 LAB — POCT I-STAT 3, ART BLOOD GAS (G3+)
Acid-Base Excess: 2
Bicarbonate: 26.9 — ABNORMAL HIGH
TCO2: 28

## 2010-12-24 LAB — POCT I-STAT 3, VENOUS BLOOD GAS (G3P V)
Acid-base deficit: 1
O2 Saturation: 70
pO2, Ven: 40

## 2010-12-25 LAB — GLUCOSE, CAPILLARY
Glucose-Capillary: 107 — ABNORMAL HIGH
Glucose-Capillary: 110 — ABNORMAL HIGH
Glucose-Capillary: 115 — ABNORMAL HIGH
Glucose-Capillary: 125 — ABNORMAL HIGH
Glucose-Capillary: 129 — ABNORMAL HIGH
Glucose-Capillary: 139 — ABNORMAL HIGH

## 2010-12-25 LAB — CBC
HCT: 29.6 — ABNORMAL LOW
HCT: 30.9 — ABNORMAL LOW
HCT: 34.8 — ABNORMAL LOW
HCT: 42.7
Hemoglobin: 11 — ABNORMAL LOW
Hemoglobin: 11.7 — ABNORMAL LOW
Hemoglobin: 12 — ABNORMAL LOW
Hemoglobin: 14.5
MCHC: 33.9
MCHC: 33.9
MCHC: 34.3
MCV: 95.6
MCV: 95.7
MCV: 97.1
Platelets: 103 — ABNORMAL LOW
Platelets: 143 — ABNORMAL LOW
Platelets: 91 — ABNORMAL LOW
RBC: 3.05 — ABNORMAL LOW
RBC: 3.63 — ABNORMAL LOW
RDW: 13.1
RDW: 13.4
RDW: 13.4
WBC: 10.5
WBC: 10.9 — ABNORMAL HIGH
WBC: 11.2 — ABNORMAL HIGH
WBC: 14.2 — ABNORMAL HIGH

## 2010-12-25 LAB — POCT I-STAT 4, (NA,K, GLUC, HGB,HCT)
Glucose, Bld: 111 — ABNORMAL HIGH
Glucose, Bld: 132 — ABNORMAL HIGH
Glucose, Bld: 143 — ABNORMAL HIGH
Glucose, Bld: 60 — ABNORMAL LOW
HCT: 24 — ABNORMAL LOW
HCT: 25 — ABNORMAL LOW
Hemoglobin: 11.9 — ABNORMAL LOW
Hemoglobin: 12.6 — ABNORMAL LOW
Hemoglobin: 8.2 — ABNORMAL LOW
Hemoglobin: 8.5 — ABNORMAL LOW
Potassium: 3.5
Potassium: 3.9
Potassium: 4.1
Potassium: 4.4
Sodium: 136
Sodium: 139
Sodium: 139
Sodium: 140

## 2010-12-25 LAB — POCT I-STAT 3, ART BLOOD GAS (G3+)
Acid-Base Excess: 3 — ABNORMAL HIGH
Acid-base deficit: 1
Bicarbonate: 23.4
O2 Saturation: 100
O2 Saturation: 100
O2 Saturation: 100
pCO2 arterial: 37.2
pCO2 arterial: 38.6
pCO2 arterial: 43
pH, Arterial: 7.395
pH, Arterial: 7.418
pO2, Arterial: 187 — ABNORMAL HIGH
pO2, Arterial: 202 — ABNORMAL HIGH
pO2, Arterial: 260 — ABNORMAL HIGH
pO2, Arterial: 283 — ABNORMAL HIGH

## 2010-12-25 LAB — POCT I-STAT, CHEM 8
BUN: 14
Calcium, Ion: 1.17
Chloride: 103
Chloride: 105
Creatinine, Ser: 1
Glucose, Bld: 134 — ABNORMAL HIGH
HCT: 32 — ABNORMAL LOW
HCT: 34 — ABNORMAL LOW
Hemoglobin: 10.5 — ABNORMAL LOW
Hemoglobin: 10.9 — ABNORMAL LOW
Potassium: 4.5
Sodium: 138
Sodium: 141
TCO2: 20
TCO2: 23

## 2010-12-25 LAB — BLOOD GAS, ARTERIAL
Acid-Base Excess: 0.7
Bicarbonate: 24.6 — ABNORMAL HIGH
FIO2: 0.21
O2 Saturation: 98
Patient temperature: 98.6

## 2010-12-25 LAB — BASIC METABOLIC PANEL
BUN: 13
BUN: 14
CO2: 21
Calcium: 7.9 — ABNORMAL LOW
Chloride: 103
Creatinine, Ser: 0.99
GFR calc Af Amer: >60
GFR calc non Af Amer: >60
Glucose, Bld: 121 — ABNORMAL HIGH
Potassium: 3.7
Sodium: 135
Sodium: 137

## 2010-12-25 LAB — URINALYSIS, ROUTINE W REFLEX MICROSCOPIC
Bilirubin Urine: NEGATIVE
Glucose, UA: NEGATIVE
Ketones, ur: NEGATIVE
Specific Gravity, Urine: 1.013
pH: 7

## 2010-12-25 LAB — CK TOTAL AND CKMB (NOT AT ARMC)
CK, MB: 6.6 — ABNORMAL HIGH
Total CK: 287 — ABNORMAL HIGH

## 2010-12-25 LAB — POCT I-STAT GLUCOSE
Glucose, Bld: 153 — ABNORMAL HIGH
Glucose, Bld: 158 — ABNORMAL HIGH
Operator id: 3406
Operator id: 3406

## 2010-12-25 LAB — COMPREHENSIVE METABOLIC PANEL
BUN: 12
Calcium: 9.3
Creatinine, Ser: 0.7
GFR calc non Af Amer: >60
Glucose, Bld: 111 — ABNORMAL HIGH
Sodium: 140
Total Protein: 6.6

## 2010-12-25 LAB — PROTIME-INR
INR: 1.1
INR: 1.7 — ABNORMAL HIGH
Prothrombin Time: 14.6
Prothrombin Time: 20.4 — ABNORMAL HIGH

## 2010-12-25 LAB — HEMOGLOBIN A1C
Hgb A1c MFr Bld: 5.7
Mean Plasma Glucose: 126

## 2010-12-25 LAB — CREATININE, SERUM
Creatinine, Ser: 0.81
GFR calc Af Amer: >60
GFR calc non Af Amer: >60
GFR calc non Af Amer: >60

## 2010-12-25 LAB — HEMOGLOBIN AND HEMATOCRIT, BLOOD
HCT: 26.6 — ABNORMAL LOW
Hemoglobin: 9 — ABNORMAL LOW

## 2010-12-25 LAB — APTT
aPTT: 32
aPTT: 37

## 2010-12-25 LAB — MAGNESIUM
Magnesium: 2.3
Magnesium: 2.8 — ABNORMAL HIGH

## 2012-11-07 ENCOUNTER — Other Ambulatory Visit: Payer: Self-pay | Admitting: Dermatology

## 2013-09-12 ENCOUNTER — Telehealth: Payer: Self-pay | Admitting: Genetic Counselor

## 2013-09-12 NOTE — Telephone Encounter (Signed)
S/W PATIENT AND GAVE GENETIC APPT FOR 07/15 @ 11 W/GENETIC COUNSELOR.  SELF REFERRAL WELCOME PACKET MAILED

## 2013-10-03 ENCOUNTER — Telehealth: Payer: Self-pay | Admitting: Genetic Counselor

## 2013-10-03 NOTE — Telephone Encounter (Signed)
PATIENT CALLED TO R/S GENETIC APPT TO 07/09 @ 1:30.

## 2013-10-04 ENCOUNTER — Encounter: Payer: Self-pay | Admitting: Genetic Counselor

## 2013-10-04 ENCOUNTER — Other Ambulatory Visit: Payer: Medicare Other

## 2013-10-04 ENCOUNTER — Ambulatory Visit (HOSPITAL_BASED_OUTPATIENT_CLINIC_OR_DEPARTMENT_OTHER): Payer: Medicare Other | Admitting: Genetic Counselor

## 2013-10-04 DIAGNOSIS — Z803 Family history of malignant neoplasm of breast: Secondary | ICD-10-CM | POA: Insufficient documentation

## 2013-10-04 DIAGNOSIS — Z8481 Family history of carrier of genetic disease: Secondary | ICD-10-CM | POA: Insufficient documentation

## 2013-10-04 NOTE — Progress Notes (Signed)
Patient Name: [name] Patient Age: 68 y.o. Encounter Date: 10/04/2013   Primary Care Provider: Georgianne Fick, MD   [name], a 68 y.o. male, is [facility] due to a family history of breast cancer. He presents to clinic today to discuss genetic testing given recent testing in his family revealing a pathogenic BRCA1 mutation.  HISTORY OF PRESENT ILLNESS: [name] has no personal history of cancer and is in generally good health. He states that he has a yearly visit with his PCP and had a negative colonoscopy a few years ago.   Past Medical History  Diagnosis Date  . Family history of malignant neoplasm of breast   . FHx: BRCA1 gene positive     BRCA1 mutation in niece    History   Social History  . Marital Status: Married    Spouse Name: N/A    Number of Children: N/A  . Years of Education: N/A   Social History Main Topics  . Smoking status: Not on file  . Smokeless tobacco: Not on file  . Alcohol Use: Not on file  . Drug Use: Not on file  . Sexual Activity: Not on file   Other Topics Concern  . Not on file   Social History Narrative  . No narrative on file     FAMILY HISTORY:   During the visit, a 4-generation pedigree was obtained. Significant diagnoses include the following:  Family History  Problem Relation Age of Onset  . Cancer Sister 38    gallbladder vs. pancreatic; deceased at 52  . Breast cancer Maternal Aunt     2 mat aunts; age at dx unknown  . Breast cancer Other 23    sister's daughter; BRCA1 positive ([id number]); Ashekenazi Jewish founder mutation    Of note, his niece's father (not blood related to [name]) is of Ashkenazi Jewish ancestry. He refuses to get tested.  [name] ancestry is [place], [place] and [place], but he states that there is no known Jewish ancestry and no consanguinity.  ASSESSMENT AND PLAN: [name] is a 68 y.o. male with a family history of breast  cancer and a BRCA1 mutation ([id number]) identified in his niece. We reviewed the characteristics, features and inheritance patterns of hereditary cancer syndromes and the BRCA1 gene. This mutation is one of the 3 founder mutations in the Ashkenazi Jewish population. While [name] is not known to be of Jewish ancestry, his ancestors are [place]. Genetic testing is warranted to determine whether he has this mutation as it will be extremely informative for his relatives. We discussed that whenever one of the founder mutations is identified, the recommendation is to test for all 3 founder mutations.   We also discussed genetic testing, including the other appropriate family members to test, and the process of testing. He is aware that his insurance, Medicare, will not pay for this testing and he is opting to pay for it himself.  [name] wished to pursue genetic testing and a blood sample will be sent to [facility] for analysis of the 3 Ashkenazi Jewish founder mutations. We discussed the implications of a positive and negative result. Results should be available in approximately 2-3 weeks, at which point we will contact him and address implications for him as well as address genetic testing for at-risk family members, if needed.    We encouraged [name] to remain in contact with Cancer Genetics annually so that we can  update the family history and inform him of any changes in cancer genetics and testing that may be of benefit for this family. Mr.  [name] questions were answered to his satisfaction today.   Thank you for the referral and allowing [place] to share in the care of your patient.   The patient was seen for a total of 30 minutes, greater than 50% of which was spent face-to-face counseling. This patient was discussed with the overseeing provider who agrees with the above.

## 2013-10-10 ENCOUNTER — Other Ambulatory Visit: Payer: Medicare Other

## 2013-10-17 ENCOUNTER — Encounter: Payer: Self-pay | Admitting: Genetic Counselor

## 2013-10-17 NOTE — Progress Notes (Signed)
Referring Physician: Self  [name] was called today to discuss genetic test results. Please see the Genetics note from his visit on 10/04/13 for a detailed discussion of his personal and family history.  GENETIC TESTING: At the time of [name] visit, we recommended he pursue genetic testing for the three common BRCA mutations found in the Ashkenazi Jewish population (BRCA1 [id number]_69delAG (185delAG), BRCA1 c.5266dupC (5382insC), or BRCA2 c.5946delT (6174delT)). This was recommended because his niece was found to harbor one of these mutations. Genetic testing, which was performed at [facility], did not reveal any of these three mutations. This is considered a "true negative" result for [name] and means that he is not passing one of these mutations to his children.  CANCER SCREENING: Even though [name] did not inherit the familial gene mutation, others in the family may have. It is very important all of [name] relatives (male and male) know of the presence of this mutation and that they may be at increased risk for cancer. Genetic testing can sort out who in your family is at risk and who is not.  Please let us know if we can help facilitate testing. Genetic counselors can be located in other cities, by visiting the website of the [employer] (ArtistMovie.se) and Field seismologist for a Dietitian by zip code.  Lastly, we discussed with [name] that cancer genetics is a rapidly advancing field and it is possible that new genetic tests will be appropriate for him in the future. We encouraged him to remain in contact with [place] on an annual basis so we can update his personal and family histories, and let him know of advances in cancer genetics that may benefit the family. Our contact number was provided. [name] questions were answered to his satisfaction today, and he knows he is welcome to call anytime with additional questions.    Steele Berg,  MS, [place] Certified Genetic Counseor phone: [phone] [email or web address]

## 2014-03-24 NOTE — Progress Notes (Signed)
Patient ID: [name], male   DOB: 07-Dec-1945, 68 y.o.   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  Post op course complicated by afib with short course of anticoagulation.    FINAL IMPRESSION: 1. 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 2. Normal left ventricular function with ejection fraction of 65%. 3. Normal aortic valve. 4. Normal right-sided cardiac chambers. 5. No left atrial appendage thrombus. 6. No atrial septal defect.  PROCEDURE PERFORMED:  Roxy Manns 2009  1. Right minithoracotomy for Mitral valve repair with triangular  resection of posterior leaflet and 28-mm Edwards Physio II Ring  Annuloplasty.  Cath prior to surgery reviewed and no CAD normal EF 60%    History of cervical spine surgery by Dr Joya Salm.  Has been doing well. No palpitations , dyspnea or chest pain.  Also history of orthostatic hypotension originally seen by Dr Caryl Comes 2007  Sees dentist regularly with SBE prophylaxis.   Genetic testing for BRCA1 done recently negative    ROS: Denies fever, malais, weight loss, blurry vision, decreased visual acuity, cough, sputum, SOB, hemoptysis, pleuritic pain, palpitaitons, heartburn, abdominal pain, melena, lower extremity edema, claudication, or rash.  All other systems reviewed and negative   General: Affect appropriate Healthy:  appears stated age 68: normal Neck supple with no adenopathy JVP normal no bruits no thyromegaly Lungs clear with no wheezing and good diaphragmatic motion Heart:  S1/S2 no murmur,rub, gallop or click previous mini right thoracotomy  PMI normal Abdomen: benighn, BS positve, no tenderness, no AAA no bruit.  No HSM or HJR Distal pulses intact with no bruits No edema Neuro non-focal Skin warm and dry No muscular  weakness  Medications No current outpatient prescriptions on file.   No current facility-administered medications for this visit.    Allergies Review of patient's allergies indicates no known allergies.  Family History: Family History  Problem Relation Age of Onset  . Cancer Sister 70    gallbladder vs. pancreatic; deceased at 5  . Breast cancer Maternal Aunt     2 mat aunts; age at dx unknown  . Breast cancer Other 24    sister's daughter; BRCA1 positive ([id number]); Ashekenazi Jewish founder mutation    Social History: History   Social History  . Marital Status: Married    Spouse Name: N/A    Number of Children: N/A  . Years of Education: N/A   Occupational History  . Not on file.   Social History Main Topics  . Smoking status: Not on file  . Smokeless tobacco: Not on file  . Alcohol Use: Not on file  . Drug Use: Not on file  . Sexual Activity: Not on file   Other Topics Concern  . Not on file   Social History Narrative  . No narrative on file    No past surgical history on file.  Past Medical History  Diagnosis Date  . Family history of malignant neoplasm of breast   . FHx: BRCA1 gene positive     BRCA1 mutation in niece    Electrocardiogram:  4/12  SR rate 72 normal ECG  Today SR rate 71 normal   Assessment and Plan

## 2014-03-26 ENCOUNTER — Encounter: Payer: Self-pay | Admitting: Cardiovascular Disease

## 2014-03-26 ENCOUNTER — Ambulatory Visit (INDEPENDENT_AMBULATORY_CARE_PROVIDER_SITE_OTHER): Payer: Medicare Other | Admitting: Cardiovascular Disease

## 2014-03-26 VITALS — BP 120/68 | HR 71 | Ht 72.0 in | Wt 187.0 lb

## 2014-03-26 DIAGNOSIS — Z9889 Other specified postprocedural states: Secondary | ICD-10-CM

## 2014-03-26 DIAGNOSIS — Z803 Family history of malignant neoplasm of breast: Secondary | ICD-10-CM

## 2014-03-26 DIAGNOSIS — I480 Paroxysmal atrial fibrillation: Secondary | ICD-10-CM

## 2014-03-26 DIAGNOSIS — G903 Multi-system degeneration of the autonomic nervous system: Secondary | ICD-10-CM

## 2014-03-26 NOTE — Assessment & Plan Note (Signed)
No murmur on exam  SBE prophylaxis  Echo to assess repair make sure no residual MR of diastolic gradient.  Assess LA size and risk for recurrent PAF

## 2014-03-26 NOTE — Assessment & Plan Note (Signed)
Resolved in setting of severe MR and post op MVR  ASA no need for current anticoagulation

## 2014-03-26 NOTE — Assessment & Plan Note (Signed)
Recolved in setting of MVP and dysautonomia  Maintain normal salt diet avoid diuretics

## 2014-03-26 NOTE — Assessment & Plan Note (Signed)
Family history of breast CA BRCA1 genetic testing normal

## 2014-03-26 NOTE — Patient Instructions (Signed)

## 2014-03-27 ENCOUNTER — Ambulatory Visit (HOSPITAL_COMMUNITY): Payer: Medicare Other | Attending: Cardiology | Admitting: Radiology

## 2014-03-27 DIAGNOSIS — Z9889 Other specified postprocedural states: Secondary | ICD-10-CM | POA: Diagnosis not present

## 2014-03-27 DIAGNOSIS — R002 Palpitations: Secondary | ICD-10-CM | POA: Diagnosis present

## 2014-03-27 DIAGNOSIS — R42 Dizziness and giddiness: Secondary | ICD-10-CM | POA: Diagnosis not present

## 2014-03-27 NOTE — Progress Notes (Signed)
Echocardiogram performed.  

## 2014-04-24 DIAGNOSIS — D229 Melanocytic nevi, unspecified: Secondary | ICD-10-CM | POA: Diagnosis not present

## 2014-04-24 DIAGNOSIS — Z85828 Personal history of other malignant neoplasm of skin: Secondary | ICD-10-CM | POA: Diagnosis not present

## 2014-04-24 DIAGNOSIS — L821 Other seborrheic keratosis: Secondary | ICD-10-CM | POA: Diagnosis not present

## 2014-04-24 DIAGNOSIS — L570 Actinic keratosis: Secondary | ICD-10-CM | POA: Diagnosis not present

## 2014-07-25 DIAGNOSIS — H04123 Dry eye syndrome of bilateral lacrimal glands: Secondary | ICD-10-CM | POA: Diagnosis not present

## 2014-07-25 DIAGNOSIS — Z961 Presence of intraocular lens: Secondary | ICD-10-CM | POA: Diagnosis not present

## 2014-07-25 DIAGNOSIS — H35371 Puckering of macula, right eye: Secondary | ICD-10-CM | POA: Diagnosis not present

## 2014-07-25 DIAGNOSIS — H2511 Age-related nuclear cataract, right eye: Secondary | ICD-10-CM | POA: Diagnosis not present

## 2014-12-25 DIAGNOSIS — Z23 Encounter for immunization: Secondary | ICD-10-CM | POA: Diagnosis not present

## 2015-03-04 DIAGNOSIS — Z125 Encounter for screening for malignant neoplasm of prostate: Secondary | ICD-10-CM | POA: Diagnosis not present

## 2015-03-04 DIAGNOSIS — Z0000 Encounter for general adult medical examination without abnormal findings: Secondary | ICD-10-CM | POA: Diagnosis not present

## 2015-03-04 DIAGNOSIS — E782 Mixed hyperlipidemia: Secondary | ICD-10-CM | POA: Diagnosis not present

## 2015-03-11 DIAGNOSIS — M5136 Other intervertebral disc degeneration, lumbar region: Secondary | ICD-10-CM | POA: Diagnosis not present

## 2015-03-11 DIAGNOSIS — E782 Mixed hyperlipidemia: Secondary | ICD-10-CM | POA: Diagnosis not present

## 2015-03-11 DIAGNOSIS — Z0000 Encounter for general adult medical examination without abnormal findings: Secondary | ICD-10-CM | POA: Diagnosis not present

## 2015-03-11 DIAGNOSIS — M5030 Other cervical disc degeneration, unspecified cervical region: Secondary | ICD-10-CM | POA: Diagnosis not present

## 2015-03-19 DIAGNOSIS — Z1211 Encounter for screening for malignant neoplasm of colon: Secondary | ICD-10-CM | POA: Diagnosis not present

## 2015-03-19 DIAGNOSIS — K5900 Constipation, unspecified: Secondary | ICD-10-CM | POA: Diagnosis not present

## 2015-03-19 DIAGNOSIS — R197 Diarrhea, unspecified: Secondary | ICD-10-CM | POA: Diagnosis not present

## 2015-04-10 DIAGNOSIS — K5730 Diverticulosis of large intestine without perforation or abscess without bleeding: Secondary | ICD-10-CM | POA: Diagnosis not present

## 2015-04-10 DIAGNOSIS — D123 Benign neoplasm of transverse colon: Secondary | ICD-10-CM | POA: Diagnosis not present

## 2015-04-10 DIAGNOSIS — Z1211 Encounter for screening for malignant neoplasm of colon: Secondary | ICD-10-CM | POA: Diagnosis not present

## 2015-04-10 DIAGNOSIS — K635 Polyp of colon: Secondary | ICD-10-CM | POA: Diagnosis not present

## 2015-04-18 ENCOUNTER — Encounter: Payer: Self-pay | Admitting: *Deleted

## 2015-04-25 NOTE — Progress Notes (Signed)
Patient ID: [name], male   DOB: 1945/08/08, 70 y.o.   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  Post op course complicated by afib with short course of anticoagulation.    FINAL IMPRESSION: 1. 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 2. Normal left ventricular function with ejection fraction of 65%. 3. Normal aortic valve. 4. Normal right-sided cardiac chambers. 5. No left atrial appendage thrombus. 6. No atrial septal defect.  PROCEDURE PERFORMED:  Roxy Manns 2009  1. Right minithoracotomy for Mitral valve repair with triangular  resection of posterior leaflet and 28-mm Edwards Physio II Ring  Annuloplasty.  Cath prior to surgery reviewed and no CAD normal EF 60%  F/U echo reviewed done  03/27/14  Study Conclusions  - Left ventricle: There is a false tendon in the LV apex of no clinical significance. The cavity size was normal. Systolic function was normal. The estimated ejection fraction was in the range of 55% to 60%. Wall motion was normal; there were no regional wall motion abnormalities. - Aortic valve: Mild thickening and calcification. - Mitral valve: S/P MV repair. The findings are consistent with mild stenosis. - Tricuspid valve: There was mild regurgitation. - Pulmonic valve: There was trivial regurgitation.   History of cervical spine surgery by Dr Joya Salm.  Has been doing well. No palpitations , dyspnea or chest pain.  Also history of orthostatic hypotension originally seen by Dr Caryl Comes 2007  Sees dentist regularly with SBE prophylaxis.   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  All other systems reviewed and negative   General: Affect appropriate Healthy:  appears stated age HEENT: erythema forehead facial peel  Neck supple with no adenopathy JVP normal no bruits no thyromegaly Lungs clear with no wheezing and good diaphragmatic motion Heart:  S1/S2 no murmur,rub, gallop or click previous mini right thoracotomy  PMI normal Abdomen: benighn, BS positve, no tenderness, no AAA no bruit.  No HSM or HJR Distal pulses intact with no bruits No edema Neuro non-focal Skin warm and dry No muscular weakness  Medications Current Outpatient Prescriptions  Medication Sig Dispense Refill  . aspirin 81 MG tablet Take 81 mg by mouth daily.    Marland Kitchen atorvastatin (LIPITOR) 40 MG tablet Take 1 tablet by mouth daily.  0  . cetirizine (ZYRTEC) 10 MG tablet Take 10 mg by mouth daily.    Marland Kitchen MAGNESIUM PO Take 1 tablet by mouth daily.     . Omega-3 Fatty Acids (FISH OIL PO) Take 1 tablet by mouth daily.      No current facility-administered medications for this visit.    Allergies Review of patient's allergies indicates no known allergies.  Family History: Family History  Problem Relation Age of Onset  . Cancer Sister 58    gallbladder vs. pancreatic; deceased at 33  . Breast cancer Maternal Aunt     2 mat aunts; age at dx unknown  . Breast cancer Other 45    sister's daughter; BRCA1 positive ([id number]); Ashekenazi Jewish founder mutation  Social History: Social History   Social History  . Marital Status: Married    Spouse Name: N/A  . Number of Children: N/A  . Years of Education: N/A   Occupational History  . Not on file.   Social History Main Topics  . Smoking status: Former Research scientist (life sciences)  . Smokeless tobacco: Never Used     Comment: in his 20's  . Alcohol Use: 0.0 oz/week     0 Standard drinks or equivalent per week     Comment: occasional  . Drug Use: No  . Sexual Activity: Not on file   Other Topics Concern  . Not on file   Social History Narrative    Past Surgical History  Procedure Laterality Date  . Mitral valve replacement    . Rotator cuff repair    . Neck surgery    . Back surgery    . Eye surgery    . Tonsillectomy    . Wisdom tooth extraction      Past Medical History  Diagnosis Date  . Family history of malignant neoplasm of breast   . FHx: BRCA1 gene positive     BRCA1 mutation in niece    Electrocardiogram:  4/12  SR rate 72 normal ECG   03/26/14 SR rate 71 normal   Assessment and Plan MVR:  SBE prophylaxis no residual MR stable PAF: related to surgery no recurrence off anticoagulation BRCA1 Gene positive: Chol:  Cholesterol is at goal.  Continue current dose of statin and diet Rx.  No myalgias or side effects.  F/U  LFT's in 6 months. No results found for: [facility]         Jenkins Rouge

## 2015-05-01 ENCOUNTER — Ambulatory Visit (INDEPENDENT_AMBULATORY_CARE_PROVIDER_SITE_OTHER): Payer: Medicare Other | Admitting: Cardiovascular Disease

## 2015-05-01 ENCOUNTER — Encounter: Payer: Self-pay | Admitting: Cardiovascular Disease

## 2015-05-01 VITALS — BP 100/64 | HR 70 | Ht 72.0 in | Wt 189.8 lb

## 2015-05-01 DIAGNOSIS — Z9889 Other specified postprocedural states: Secondary | ICD-10-CM

## 2015-05-01 DIAGNOSIS — I480 Paroxysmal atrial fibrillation: Secondary | ICD-10-CM

## 2015-05-01 NOTE — Patient Instructions (Signed)

## 2015-05-08 DIAGNOSIS — L570 Actinic keratosis: Secondary | ICD-10-CM | POA: Diagnosis not present

## 2015-05-08 DIAGNOSIS — L821 Other seborrheic keratosis: Secondary | ICD-10-CM | POA: Diagnosis not present

## 2015-05-08 DIAGNOSIS — D225 Melanocytic nevi of trunk: Secondary | ICD-10-CM | POA: Diagnosis not present

## 2015-06-24 DIAGNOSIS — Z23 Encounter for immunization: Secondary | ICD-10-CM | POA: Diagnosis not present

## 2015-07-01 DIAGNOSIS — M25562 Pain in left knee: Secondary | ICD-10-CM | POA: Diagnosis not present

## 2015-07-28 DIAGNOSIS — Z0000 Encounter for general adult medical examination without abnormal findings: Secondary | ICD-10-CM | POA: Diagnosis not present

## 2015-07-29 DIAGNOSIS — R040 Epistaxis: Secondary | ICD-10-CM | POA: Diagnosis not present

## 2015-07-30 DIAGNOSIS — E782 Mixed hyperlipidemia: Secondary | ICD-10-CM | POA: Diagnosis not present

## 2015-08-07 DIAGNOSIS — H04123 Dry eye syndrome of bilateral lacrimal glands: Secondary | ICD-10-CM | POA: Diagnosis not present

## 2015-08-07 DIAGNOSIS — Z0100 Encounter for examination of eyes and vision without abnormal findings: Secondary | ICD-10-CM | POA: Diagnosis not present

## 2015-08-07 DIAGNOSIS — H35371 Puckering of macula, right eye: Secondary | ICD-10-CM | POA: Diagnosis not present

## 2015-08-07 DIAGNOSIS — Z961 Presence of intraocular lens: Secondary | ICD-10-CM | POA: Diagnosis not present

## 2015-08-12 DIAGNOSIS — M545 Low back pain: Secondary | ICD-10-CM | POA: Diagnosis not present

## 2016-03-12 DIAGNOSIS — M150 Primary generalized (osteo)arthritis: Secondary | ICD-10-CM | POA: Diagnosis not present

## 2016-03-12 DIAGNOSIS — E782 Mixed hyperlipidemia: Secondary | ICD-10-CM | POA: Diagnosis not present

## 2016-03-12 DIAGNOSIS — Z0000 Encounter for general adult medical examination without abnormal findings: Secondary | ICD-10-CM | POA: Diagnosis not present

## 2016-03-12 DIAGNOSIS — Z125 Encounter for screening for malignant neoplasm of prostate: Secondary | ICD-10-CM | POA: Diagnosis not present

## 2016-03-12 DIAGNOSIS — Z79899 Other long term (current) drug therapy: Secondary | ICD-10-CM | POA: Diagnosis not present

## 2016-03-16 DIAGNOSIS — M25562 Pain in left knee: Secondary | ICD-10-CM | POA: Diagnosis not present

## 2016-03-17 DIAGNOSIS — M5030 Other cervical disc degeneration, unspecified cervical region: Secondary | ICD-10-CM | POA: Diagnosis not present

## 2016-03-17 DIAGNOSIS — M150 Primary generalized (osteo)arthritis: Secondary | ICD-10-CM | POA: Diagnosis not present

## 2016-03-17 DIAGNOSIS — M5136 Other intervertebral disc degeneration, lumbar region: Secondary | ICD-10-CM | POA: Diagnosis not present

## 2016-03-17 DIAGNOSIS — E782 Mixed hyperlipidemia: Secondary | ICD-10-CM | POA: Diagnosis not present

## 2016-09-10 ENCOUNTER — Ambulatory Visit: Payer: Self-pay | Admitting: Cardiovascular Disease

## 2017-01-18 NOTE — Progress Notes (Signed)
CARDIOLOGY OFFICE NOTE  Date:  01/19/2017    [name] Date of Birth: 11-30-1945 Medical Record #[id number]  PCP:  Merrilee Seashore, MD  Cardiologist:  Johnsie Cancel  Chief Complaint  Patient presents with  . Cardiac Valve Problem    Follow up visit - seen for Dr. Johnsie Cancel    History of Present Illness: [name] is a 71 y.o. male who presents today for a follow up visit. Seen for Dr. Johnsie Cancel.   He has has a prior complex MV repair by Dr. Clydene Laming for flail posterior leaflet back in 2009. His post op course was complicated by AF requiring a short course of anticoagulation. No CAD by his cath prior to cardiac surgery noted. He has had a remote history of orthostatic hypotension and has seen Dr. Caryl Comes in the past.   Last seen here back in February of 2017. Felt to be doing ok.   Comes in today. Here alone. He says this visit is "complicated". In 2017 - decided to spend a year in [place] - left in June of 2017 - liked [place] enough that he asked for an extension on his Page. Went to [place] in April and had a seizure - was hospitalized - had another seizure - does not really remember a lot of those details. 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 His short term memory has been affected. He is unsure of his medicines. Says he is being weaned off of Medrol. He is on Keppra and Vimpat. He has received Rituxan and will be on a maintenance therapy with this.  Planning on going back to [place] for his neurology care. He says his heart is doing fine. He is unsure if he has had a follow up echo since 2015. Says he is really here because of bruising - wondering if he should still be on aspirin. He is here thru next Tuesday. Apparently has had some issues with fever - told it was due to being immunocompromised. Sounds like he still does SBE. He was placed on  Metformin due to the steroids causing elevated glucose.   Past Medical History:  Diagnosis Date  . Family history of malignant neoplasm of breast   . FHx: BRCA1 gene positive    BRCA1 mutation in niece    Past Surgical History:  Procedure Laterality Date  . BACK SURGERY    . EYE SURGERY    . MITRAL VALVE REPLACEMENT    . NECK SURGERY    . ROTATOR CUFF REPAIR    . TONSILLECTOMY    . WISDOM TOOTH EXTRACTION       Medications: Current Meds  Medication Sig  . aspirin 81 MG tablet Take 81 mg by mouth daily.  Marland Kitchen atorvastatin (LIPITOR) 40 MG tablet Take 1 tablet by mouth daily.  . cetirizine (ZYRTEC) 10 MG tablet Take 10 mg by mouth daily.  Marland Kitchen lacosamide (VIMPAT) 50 MG TABS tablet Take 50 mg by mouth daily.  Marland Kitchen levETIRAcetam (KEPPRA) 500 MG tablet Take 500 mg by mouth 2 (two) times daily.  Marland Kitchen MAGNESIUM PO Take 1 tablet by mouth daily.   . metFORMIN (GLUCOPHAGE) 850 MG tablet Take 850 mg by mouth 2 (two) times daily with a meal.  . methylPREDNISolone (MEDROL) 4 MG tablet Take 4 mg by mouth daily.  . Omega-3 Fatty Acids (FISH OIL PO) Take 1  tablet by mouth daily.   . RiTUXimab (RITUXAN IV) Inject into the vein. Pt unsure of dosage     Allergies: No Known Allergies  Social History: The patient  reports that he has quit smoking. He has never used smokeless tobacco. He reports that he drinks alcohol. He reports that he does not use drugs.   Family History: The patient's family history includes Breast cancer in his maternal aunt; Breast cancer (age of onset: 75) in his other; Cancer (age of onset: 25) in his sister.   Review of Systems: Please see the history of present illness.   Otherwise, the review of systems is positive for none.   All other systems are reviewed and negative.   Physical Exam: VS:  BP 120/70   Pulse 80   Ht 6' (1.829 m)   Wt 165 lb 12.8 oz (75.2 kg)   SpO2 94%   BMI 22.49 kg/m  .  BMI Body mass index is 22.49 kg/m.  Wt Readings from Last 3 Encounters:    01/19/17 165 lb 12.8 oz (75.2 kg)  05/01/15 189 lb 12.8 oz (86.1 kg)  03/26/14 187 lb (84.8 kg)    General: Alert and in no acute distress. Very talkative. Weight is down.    HEENT: Normal.  Neck: Supple, no JVD, carotid bruits, or masses noted.  Cardiac: Regular rate and rhythm. No murmurs, rubs, or gallops. No edema.  Respiratory:  Lungs are clear to auscultation bilaterally with normal work of breathing.  GI: Soft and nontender.  MS: No deformity or atrophy. Gait and ROM intact.  Skin: Warm and dry. Color is normal.  Neuro:  Strength and sensation are intact and no gross focal deficits noted.  Psych: Alert, appropriate and with normal affect.   LABORATORY DATA:  EKG:  EKG is ordered today. This demonstrates NSR.  Lab Results  Component Value Date   WBC 11.2 (H) 07/09/2010   HGB 15.2 07/09/2010   HCT 44.6 07/09/2010   PLT 205 07/09/2010   GLUCOSE 89 07/09/2010   ALT 16 11/03/2007   AST 21 11/03/2007   NA 143 07/09/2010   K 4.8 07/09/2010   CL 105 07/09/2010   CREATININE 0.84 07/09/2010   BUN 20 07/09/2010   CO2 29 07/09/2010   INR 0.97 07/09/2010   HGBA1C  11/03/2007    5.7 (NOTE)   The ADA recommends the following therapeutic goal for glycemic   control related to Hgb A1C measurement:   Goal of Therapy:   < 7.0% Hgb A1C   Reference: American Diabetes Association: Clinical Practice   Recommendations 2008, Diabetes Care,  2008, 31:(Suppl 1).     BNP (last 3 results) No results for input(s): BNP in the last 8760 hours.  ProBNP (last 3 results) No results for input(s): PROBNP in the last 8760 hours.   Other Studies Reviewed Today:  Echo Study Conclusions 02/2014  - Left ventricle: There is a false tendon in the LV apex of no clinical significance. The cavity size was normal. Systolic function was normal. The estimated ejection fraction was in the range of 55% to 60%. Wall motion was normal; there were no regional wall motion abnormalities. - Aortic  valve: Mild thickening and calcification. - Mitral valve: S/P MV repair. The findings are consistent with mild stenosis. - Tricuspid valve: There was mild regurgitation. - Pulmonic valve: There was trivial regurgitation.   PROCEDURE PERFORMED:  Roxy Manns 2009  1. Right minithoracotomy for Mitral valve repair with triangular  resection of posterior  leaflet and 28-mm Edwards Physio II Designer, television/film set.  Assessment/Plan:  1. Prior MV repair - unclear if he has had recent echo with this autoimmune illness - he is leaving the country next Tuesday - will try to arrange.   2. PAF - occurred post op - remains in NSR  3. HLD - on statin - he tells me his labs have been checked in [place] extensively  4. Bruising - most likely due to his Rituxin/steroid therapy - would favor continuing the low dose aspirin for now. Platelet count was 226 in December of 2017. Labs are checked by his physician in [place].   Current medicines are reviewed with the patient today.  The patient does not have concerns regarding medicines other than what has been noted above.  The following changes have been made:  See above.  Labs/ tests ordered today include:   No orders of the defined types were placed in this encounter.    Disposition:   FU with Dr. Johnsie Cancel upon returning to the country.   Patient is agreeable to this plan and will call if any problems develop in the interim.   SignedTruitt Merle, NP  01/19/2017 2:41 PM  [facility] [address] [place], [place]  [address] Phone: [phone] Fax: [phone]

## 2017-01-19 ENCOUNTER — Encounter: Payer: Self-pay | Admitting: Nurse Practitioner

## 2017-01-19 ENCOUNTER — Ambulatory Visit (INDEPENDENT_AMBULATORY_CARE_PROVIDER_SITE_OTHER): Payer: Medicare Other | Admitting: Nurse Practitioner

## 2017-01-19 VITALS — BP 120/70 | HR 80 | Ht 72.0 in | Wt 165.8 lb

## 2017-01-19 DIAGNOSIS — Z9889 Other specified postprocedural states: Secondary | ICD-10-CM

## 2017-01-19 NOTE — Patient Instructions (Addendum)
We will be checking the following labs today - NONE   Medication Instructions:    Continue with your current medicines.   Make sure this list of medicines matches up with what you are taking and have at home.     Testing/Procedures To Be Arranged:  Echocardiogram - please try to schedule before next Tuesday - he is leaving the country  Follow-Up:   See Dr. Johnsie Cancel back when you are back in the country.     Other Special Instructions:   N/A    If you need a refill on your cardiac medications before your next appointment, please call your pharmacy.   Call the [place] office at [phone] if you have any questions, problems or concerns.

## 2017-01-21 ENCOUNTER — Other Ambulatory Visit: Payer: Self-pay

## 2017-01-21 ENCOUNTER — Ambulatory Visit (HOSPITAL_COMMUNITY): Payer: Medicare Other | Attending: Cardiovascular Disease

## 2017-01-21 DIAGNOSIS — R002 Palpitations: Secondary | ICD-10-CM | POA: Insufficient documentation

## 2017-01-21 DIAGNOSIS — Z87891 Personal history of nicotine dependence: Secondary | ICD-10-CM | POA: Diagnosis not present

## 2017-01-21 DIAGNOSIS — Z9889 Other specified postprocedural states: Secondary | ICD-10-CM | POA: Diagnosis not present

## 2017-01-21 DIAGNOSIS — I059 Rheumatic mitral valve disease, unspecified: Secondary | ICD-10-CM | POA: Diagnosis present

## 2017-01-21 DIAGNOSIS — I4891 Unspecified atrial fibrillation: Secondary | ICD-10-CM | POA: Insufficient documentation

## 2017-01-21 DIAGNOSIS — I081 Rheumatic disorders of both mitral and tricuspid valves: Secondary | ICD-10-CM | POA: Diagnosis not present

## 2017-01-26 ENCOUNTER — Telehealth: Payer: Self-pay

## 2017-01-26 NOTE — Telephone Encounter (Signed)
Patient walked in to update his medication list. Will make changes to patient's medication list.  1- Not taking cetirizine 2- Taking Magnesium occasionally 3- Started Fosamax weekly- no dose provided

## 2019-06-08 DIAGNOSIS — Z136 Encounter for screening for cardiovascular disorders: Secondary | ICD-10-CM | POA: Diagnosis not present

## 2019-06-08 DIAGNOSIS — Z0000 Encounter for general adult medical examination without abnormal findings: Secondary | ICD-10-CM | POA: Diagnosis not present

## 2019-06-08 DIAGNOSIS — Z7289 Other problems related to lifestyle: Secondary | ICD-10-CM | POA: Diagnosis not present

## 2019-06-14 DIAGNOSIS — E785 Hyperlipidemia, unspecified: Secondary | ICD-10-CM | POA: Diagnosis not present

## 2019-06-14 DIAGNOSIS — Z0000 Encounter for general adult medical examination without abnormal findings: Secondary | ICD-10-CM | POA: Diagnosis not present

## 2019-06-14 DIAGNOSIS — Z91030 Bee allergy status: Secondary | ICD-10-CM | POA: Diagnosis not present
